# Patient Record
Sex: Male | Born: 1985 | Race: White | Hispanic: No | Marital: Single | Smoking: Current every day smoker
Health system: Southern US, Community
[De-identification: ages and names within clinical notes are randomized; demographics above are authoritative.]

## PROBLEM LIST (undated history)

## (undated) DIAGNOSIS — F4310 Post-traumatic stress disorder, unspecified: Secondary | ICD-10-CM

## (undated) DIAGNOSIS — R569 Unspecified convulsions: Secondary | ICD-10-CM

## (undated) DIAGNOSIS — G4730 Sleep apnea, unspecified: Secondary | ICD-10-CM

## (undated) DIAGNOSIS — S069XAA Unspecified intracranial injury with loss of consciousness status unknown, initial encounter: Secondary | ICD-10-CM

## (undated) DIAGNOSIS — K5090 Crohn's disease, unspecified, without complications: Secondary | ICD-10-CM

## (undated) DIAGNOSIS — S069X9A Unspecified intracranial injury with loss of consciousness of unspecified duration, initial encounter: Secondary | ICD-10-CM

---

## 2008-12-14 DIAGNOSIS — K5090 Crohn's disease, unspecified, without complications: Secondary | ICD-10-CM

## 2008-12-14 HISTORY — DX: Crohn's disease, unspecified, without complications: K50.90

## 2014-07-20 ENCOUNTER — Ambulatory Visit: Payer: Self-pay | Admitting: Family Medicine

## 2016-06-19 ENCOUNTER — Emergency Department
Admission: EM | Admit: 2016-06-19 | Discharge: 2016-06-20 | Disposition: A | Discharge status: Home / Self Care | Attending: Emergency Medicine | Admitting: Emergency Medicine

## 2016-06-19 ENCOUNTER — Encounter: Payer: Self-pay | Admitting: Emergency Medicine

## 2016-06-19 DIAGNOSIS — R1084 Generalized abdominal pain: Secondary | ICD-10-CM | POA: Diagnosis not present

## 2016-06-19 DIAGNOSIS — Z79899 Other long term (current) drug therapy: Secondary | ICD-10-CM | POA: Diagnosis not present

## 2016-06-19 DIAGNOSIS — F17200 Nicotine dependence, unspecified, uncomplicated: Secondary | ICD-10-CM | POA: Diagnosis not present

## 2016-06-19 DIAGNOSIS — F4321 Adjustment disorder with depressed mood: Secondary | ICD-10-CM

## 2016-06-19 DIAGNOSIS — Z791 Long term (current) use of non-steroidal anti-inflammatories (NSAID): Secondary | ICD-10-CM | POA: Insufficient documentation

## 2016-06-19 DIAGNOSIS — F4323 Adjustment disorder with mixed anxiety and depressed mood: Secondary | ICD-10-CM

## 2016-06-19 DIAGNOSIS — R45851 Suicidal ideations: Secondary | ICD-10-CM | POA: Diagnosis present

## 2016-06-19 DIAGNOSIS — Z046 Encounter for general psychiatric examination, requested by authority: Secondary | ICD-10-CM

## 2016-06-19 DIAGNOSIS — F4310 Post-traumatic stress disorder, unspecified: Secondary | ICD-10-CM

## 2016-06-19 DIAGNOSIS — K0889 Other specified disorders of teeth and supporting structures: Secondary | ICD-10-CM

## 2016-06-19 HISTORY — DX: Post-traumatic stress disorder, unspecified: F43.10

## 2016-06-19 HISTORY — DX: Crohn's disease, unspecified, without complications: K50.90

## 2016-06-19 LAB — CBC
HEMATOCRIT: 44.6 % (ref 40.0–52.0)
HEMOGLOBIN: 15.3 g/dL (ref 13.0–18.0)
MCH: 28.9 pg (ref 26.0–34.0)
MCHC: 34.4 g/dL (ref 32.0–36.0)
MCV: 83.9 fL (ref 80.0–100.0)
Platelets: 306 10*3/uL (ref 150–440)
RBC: 5.32 MIL/uL (ref 4.40–5.90)
RDW: 13 % (ref 11.5–14.5)
WBC: 12.4 10*3/uL — AB (ref 3.8–10.6)

## 2016-06-19 LAB — COMPREHENSIVE METABOLIC PANEL
ALBUMIN: 4.7 g/dL (ref 3.5–5.0)
ALT: 19 U/L (ref 17–63)
AST: 22 U/L (ref 15–41)
Alkaline Phosphatase: 130 U/L — ABNORMAL HIGH (ref 38–126)
Anion gap: 8 (ref 5–15)
BUN: 9 mg/dL (ref 6–20)
CHLORIDE: 101 mmol/L (ref 101–111)
CO2: 29 mmol/L (ref 22–32)
CREATININE: 1.03 mg/dL (ref 0.61–1.24)
Calcium: 9.9 mg/dL (ref 8.9–10.3)
GFR calc Af Amer: >60 mL/min (ref >60)
GLUCOSE: 109 mg/dL — AB (ref 65–99)
POTASSIUM: 4.6 mmol/L (ref 3.5–5.1)
SODIUM: 138 mmol/L (ref 135–145)
Total Bilirubin: 0.5 mg/dL (ref 0.3–1.2)
Total Protein: 8 g/dL (ref 6.5–8.1)

## 2016-06-19 MED ORDER — CEPHALEXIN 500 MG PO CAPS
500.0000 mg | ORAL_CAPSULE | Freq: Once | ORAL | Status: AC
  Administered 2016-06-19: 500 mg via ORAL
  Filled 2016-06-19: qty 1

## 2016-06-19 NOTE — ED Triage Notes (Signed)
Pt presents to ED accompanied by [facility] for Suicidal ideation with no plan. Pt states he called suicide hotline, stating that he was feeling suicidal that he was going to hurt himself. Pt reports SI triggered by chronic teeth pain and chronic illness.

## 2016-06-19 NOTE — ED Notes (Signed)
Hydrocodone/APAP 5mg /325mg  count 91 tabs, verified by Blenda Mounts, RN and w/ patient. All home meds sent to pharmacy. Pt asked if he wanted a password created, stated we could and indicated his mother would pick up.

## 2016-06-20 DIAGNOSIS — F4310 Post-traumatic stress disorder, unspecified: Secondary | ICD-10-CM

## 2016-06-20 DIAGNOSIS — F4323 Adjustment disorder with mixed anxiety and depressed mood: Secondary | ICD-10-CM

## 2016-06-20 DIAGNOSIS — K0889 Other specified disorders of teeth and supporting structures: Secondary | ICD-10-CM

## 2016-06-20 DIAGNOSIS — Z046 Encounter for general psychiatric examination, requested by authority: Secondary | ICD-10-CM

## 2016-06-20 LAB — SALICYLATE LEVEL

## 2016-06-20 LAB — ETHANOL: Alcohol, Ethyl (B): <5 mg/dL (ref <5)

## 2016-06-20 LAB — URINE DRUG SCREEN, QUALITATIVE (ARMC ONLY)
Amphetamines, Ur Screen: NOT DETECTED
BARBITURATES, UR SCREEN: NOT DETECTED
BENZODIAZEPINE, UR SCRN: NOT DETECTED
Cannabinoid 50 Ng, Ur ~~LOC~~: NOT DETECTED
Cocaine Metabolite,Ur ~~LOC~~: NOT DETECTED
MDMA (Ecstasy)Ur Screen: NOT DETECTED
METHADONE SCREEN, URINE: NOT DETECTED
Opiate, Ur Screen: POSITIVE — AB
Phencyclidine (PCP) Ur S: NOT DETECTED
TRICYCLIC, UR SCREEN: POSITIVE — AB

## 2016-06-20 LAB — ACETAMINOPHEN LEVEL

## 2016-06-20 MED ORDER — KETOROLAC TROMETHAMINE 60 MG/2ML IM SOLN
INTRAMUSCULAR | Status: AC
  Administered 2016-06-20: 60 mg via INTRAMUSCULAR
  Filled 2016-06-20: qty 2

## 2016-06-20 MED ORDER — LIDOCAINE VISCOUS 2 % MT SOLN
15.0000 mL | Freq: Once | OROMUCOSAL | Status: AC
  Administered 2016-06-20: 15 mL via OROMUCOSAL
  Filled 2016-06-20: qty 15

## 2016-06-20 MED ORDER — HYDROXYZINE HCL 25 MG PO TABS
ORAL_TABLET | ORAL | Status: AC
Start: 2016-06-20 — End: 2016-06-20
  Filled 2016-06-20: qty 1

## 2016-06-20 MED ORDER — KETOROLAC TROMETHAMINE 60 MG/2ML IM SOLN
60.0000 mg | Freq: Once | INTRAMUSCULAR | Status: AC
  Administered 2016-06-20: 60 mg via INTRAMUSCULAR
  Filled 2016-06-20: qty 2

## 2016-06-20 MED ORDER — ACETAMINOPHEN 500 MG PO TABS
1000.0000 mg | ORAL_TABLET | Freq: Once | ORAL | Status: AC
  Administered 2016-06-20: 1000 mg via ORAL
  Filled 2016-06-20: qty 2

## 2016-06-20 MED ORDER — HYDROXYZINE HCL 25 MG PO TABS
25.0000 mg | ORAL_TABLET | Freq: Three times a day (TID) | ORAL | Status: DC | PRN
  Administered 2016-06-20 (×2): 25 mg via ORAL
  Filled 2016-06-20 (×2): qty 1

## 2016-06-20 MED ORDER — AMOXICILLIN-POT CLAVULANATE 875-125 MG PO TABS: 1.0000 | ORAL_TABLET | Freq: Two times a day (BID) | ORAL | 0 refills | Status: AC

## 2016-06-20 MED ORDER — LIDOCAINE VISCOUS 2 % MT SOLN
15.0000 mL | Freq: Once | OROMUCOSAL | Status: AC
  Administered 2016-06-20: 15 mL via OROMUCOSAL
  Filled 2016-06-20 (×2): qty 15

## 2016-06-20 NOTE — ED Notes (Signed)
Patient resting quietly in room. No noted distress or abnormal behaviors noted. Will continue 15 minute checks and observation by security camera for safety. 

## 2016-06-20 NOTE — ED Notes (Signed)
Patient asleep in room. No noted distress or abnormal behavior. Will continue 15 minute checks and observation by security cameras for safety. 

## 2016-06-20 NOTE — ED Notes (Signed)
Patient came to nursing staff and stated that he was starting to feel quite panicked and upset because he was in so much pain and requested lidocaine. Per physician orders, patient was given lidocaine orally and 25 mg vistaril. Will continue to monitor patient for increased anxiety. Maintained on 15 minute checks and observation by security camera for safety.

## 2016-06-20 NOTE — ED Notes (Signed)

## 2016-06-20 NOTE — ED Notes (Signed)
Patient came over and asked when he would be discharged. Patient informed of the discharge process and told that he would be notified as soon as papers were ready. Patient agreed with this plan. Patient remains calm and cooperative at this time. Maintained on 15 minute checks and observation by security camera for safety.

## 2016-06-20 NOTE — ED Notes (Addendum)
Patient denies SI/HI/AVH and pain. Patient belongings include 2 tshirts, underwear, socks, tennis shoes, black vape, cell phone, 1 cooler of medication. Patient confirmed that all medication and belongings were present and accounted for.  Patient discharged to home, will be picked up by his mother. Patient says that he feels better and that he is stable enough to be discharged to home.

## 2016-06-20 NOTE — Consult Note (Signed)
[facility] Consult   Reason for Consult:  Consult for 30 year old man who was brought to the emergency room after calling a Hotline and reporting suicidal ideation. Referring Physician:  Jimmye Norman Patient Identification: William Vaughan MRN:  [id number] Principal Diagnosis: Adjustment disorder with mixed anxiety and depressed mood Diagnosis:   Patient Active Problem List   Diagnosis Date Noted  . Adjustment disorder with mixed anxiety and depressed mood [F43.23] 06/20/2016  . PTSD (post-traumatic stress disorder) [F43.10] 06/20/2016  . Involuntary commitment [Z04.6] 06/20/2016  . Toothache [K08.89] 06/20/2016    Total Time spent with patient: 1 hour  Subjective:   William Vaughan is a 30 y.o. male patient admitted with "I have an excruciating toothache".  HPI:  Patient seen. Chart reviewed. Labs and vitals reviewed. 31 year old man who reports a history of posttraumatic stress disorder and depression says that he's been feeling overwhelmed by his medical problems. He says that he has Crohn's disease that causes chronic abdominal and hip pain. He also has chronic toothache. He says he's been told he has about 11 broken teeth and has infection and swelling on the left side but has been unable to get the [place] system to fix it. His pain had been getting worse recently. He was having suicidal thoughts specifically related to his pain and called 911. Patient says that he is on medication for depression and PTSD including Cymbalta but doesn't remember the other medicine. Mood stays stressed out and anxious much of the time. Sleep is adequate with his current medicine. Appetite is been poor. Denies any hallucinations. Denies any drug or alcohol abuse. Patient says that he is feeling a little better today. Not quite as overwhelmed. Denies any actual wish to harm himself or hurt anyone else. He lives by himself. Seems to have minimal support. He says that he is service-connected but for some  reason has been fighting with the [facility] over the level of care he is getting.  Medical history: Diagnosis reportedly of Crohn's disease. Takes what sounds like appropriate medicine. Also diagnosis of PTSD and depression. Chronic toothache which for some reason seems to still be inadequately treated despite having been there for months.  Social history: Reports that he lives by himself. He does have some family in the area. He is not currently working. He gets disability from the [employer]. Served in the [employer] until 2012.  Substance abuse history: Past history of alcohol abuse stopped using around 2010. Denies any past drug use.  Past Psychiatric History: Patient says he has had 2 prior psychiatric hospitalizations one in [place] at the [place] and another one in [place]. Denies ever having tried to kill himself in the past. Denies any history of violence outside of appropriate behavior in the [employer]. Diagnosis of PTSD and depression. Does not remember all the medicines he has been on.  Risk to Self: Suicidal Ideation: Yes-Currently Present Suicidal Intent: No Is patient at risk for suicide?: No Suicidal Plan?: No Access to Means: No What has been your use of drugs/alcohol within the last 12 months?: None reported How many times?: 0 Other Self Harm Risks: none identified Triggers for Past Attempts: None known Intentional Self Injurious Behavior: None Risk to Others: Homicidal Ideation: No Thoughts of Harm to Others: No Current Homicidal Intent: No Current Homicidal Plan: No Access to Homicidal Means: No Identified Victim: none identified History of harm to others?: No Assessment of Violence: None Noted Violent Behavior Description: none identified Does patient have access to weapons?: No Criminal  Charges Pending?: No Does patient have a court date: No Prior Inpatient Therapy: Prior Inpatient Therapy: No Prior Therapy Dates: na Prior Therapy Facilty/Provider(s): na Reason for Treatment:  na Prior Outpatient Therapy: Prior Outpatient Therapy: Yes Prior Therapy Dates: current Prior Therapy Facilty/Provider(s): [facility] Reason for Treatment: PTSD Does patient have an ACCT team?: No Does patient have Intensive In-House Services?  : No Does patient have [facility] services? : No Does patient have P4CC services?: No  Past Medical History:  Past Medical History:  Diagnosis Date  . Crohn's disease ([place]) 12/2008  . PTSD (post-traumatic stress disorder)    History reviewed. No pertinent surgical history. Family History: History reviewed. No pertinent family history. Family Psychiatric  History: Denies knowing of any family history of mental illness Social History:  History  Alcohol use Not on file     History  Drug use: Unknown    Social History   Social History  . Marital status: Single    Spouse name: N/A  . Number of children: N/A  . Years of education: N/A   Social History Main Topics  . Smoking status: Current Every Day Smoker  . Smokeless tobacco: Never Used  . Alcohol use None  . Drug use: Unknown  . Sexual activity: Not Asked   Other Topics Concern  . None   Social History Narrative  . None   Additional Social History:    Allergies:  No Known Allergies  Labs:  Results for orders placed or performed during the hospital encounter of 06/19/16 (from the past 48 hour(s))  Comprehensive metabolic panel     Status: Abnormal   Collection Time: 06/19/16 10:42 PM  Result Value Ref Range   Sodium 138 135 - 145 mmol/L   Potassium 4.6 3.5 - 5.1 mmol/L   Chloride 101 101 - 111 mmol/L   CO2 29 22 - 32 mmol/L   Glucose, Bld 109 (H) 65 - 99 mg/dL   BUN 9 6 - 20 mg/dL   Creatinine, Ser 1.03 0.61 - 1.24 mg/dL   Calcium 9.9 8.9 - 10.3 mg/dL   Total Protein 8.0 6.5 - 8.1 g/dL   Albumin 4.7 3.5 - 5.0 g/dL   AST 22 15 - 41 U/L   ALT 19 17 - 63 U/L   Alkaline Phosphatase 130 (H) 38 - 126 U/L   Total Bilirubin 0.5 0.3 - 1.2 mg/dL   GFR calc non Af Amer >60  >60 mL/min   GFR calc Af Amer >60 >60 mL/min    Comment: (NOTE) The eGFR has been calculated using the CKD EPI equation. This calculation has not been validated in all clinical situations. eGFR's persistently <60 mL/min signify possible Chronic Kidney Disease.    Anion gap 8 5 - 15  Ethanol     Status: None   Collection Time: 06/19/16 10:42 PM  Result Value Ref Range   Alcohol, Ethyl (B) <5 <5 mg/dL    Comment:        LOWEST DETECTABLE LIMIT FOR SERUM ALCOHOL IS 5 mg/dL FOR MEDICAL PURPOSES ONLY   Salicylate level     Status: None   Collection Time: 06/19/16 10:42 PM  Result Value Ref Range   Salicylate Lvl <9.5 2.8 - 30.0 mg/dL  Acetaminophen level     Status: Abnormal   Collection Time: 06/19/16 10:42 PM  Result Value Ref Range   Acetaminophen (Tylenol), Serum <10 (L) 10 - 30 ug/mL    Comment:        THERAPEUTIC CONCENTRATIONS  VARY SIGNIFICANTLY. A RANGE OF 10-30 ug/mL MAY BE AN EFFECTIVE CONCENTRATION FOR MANY PATIENTS. HOWEVER, SOME ARE BEST TREATED AT CONCENTRATIONS OUTSIDE THIS RANGE. ACETAMINOPHEN CONCENTRATIONS >150 ug/mL AT 4 HOURS AFTER INGESTION AND >50 ug/mL AT 12 HOURS AFTER INGESTION ARE OFTEN ASSOCIATED WITH TOXIC REACTIONS.   cbc     Status: Abnormal   Collection Time: 06/19/16 10:42 PM  Result Value Ref Range   WBC 12.4 (H) 3.8 - 10.6 K/uL   RBC 5.32 4.40 - 5.90 MIL/uL   Hemoglobin 15.3 13.0 - 18.0 g/dL   HCT 44.6 40.0 - 52.0 %   MCV 83.9 80.0 - 100.0 fL   MCH 28.9 26.0 - 34.0 pg   MCHC 34.4 32.0 - 36.0 g/dL   RDW 13.0 11.5 - 14.5 %   Platelets 306 150 - 440 K/uL  Urine Drug Screen, Qualitative     Status: Abnormal   Collection Time: 06/20/16 12:05 AM  Result Value Ref Range   Tricyclic, Ur Screen POSITIVE (A) NONE DETECTED   Amphetamines, Ur Screen NONE DETECTED NONE DETECTED   MDMA (Ecstasy)Ur Screen NONE DETECTED NONE DETECTED   Cocaine Metabolite,Ur [place] NONE DETECTED NONE DETECTED   Opiate, Ur Screen POSITIVE (A) NONE DETECTED    Phencyclidine (PCP) Ur S NONE DETECTED NONE DETECTED   Cannabinoid 50 Ng, Ur [place] NONE DETECTED NONE DETECTED   Barbiturates, Ur Screen NONE DETECTED NONE DETECTED   Benzodiazepine, Ur Scrn NONE DETECTED NONE DETECTED   Methadone Scn, Ur NONE DETECTED NONE DETECTED    Comment: (NOTE) 756  Tricyclics, urine               Cutoff 1000 ng/mL 200  Amphetamines, urine             Cutoff 1000 ng/mL 300  MDMA (Ecstasy), urine           Cutoff 500 ng/mL 400  Cocaine Metabolite, urine       Cutoff 300 ng/mL 500  Opiate, urine                   Cutoff 300 ng/mL 600  Phencyclidine (PCP), urine      Cutoff 25 ng/mL 700  Cannabinoid, urine              Cutoff 50 ng/mL 800  Barbiturates, urine             Cutoff 200 ng/mL 900  Benzodiazepine, urine           Cutoff 200 ng/mL 1000 Methadone, urine                Cutoff 300 ng/mL 1100 1200 The urine drug screen provides only a preliminary, unconfirmed 1300 analytical test result and should not be used for non-medical 1400 purposes. Clinical consideration and professional judgment should 1500 be applied to any positive drug screen result due to possible 1600 interfering substances. A more specific alternate chemical method 1700 must be used in order to obtain a confirmed analytical result.  1800 Gas chromato graphy / mass spectrometry (GC/MS) is the preferred 1900 confirmatory method.     Current Facility-Administered Medications  Medication Dose Route Frequency Provider Last Rate Last Dose  . hydrOXYzine (ATARAX/VISTARIL) 25 MG tablet           . hydrOXYzine (ATARAX/VISTARIL) tablet 25 mg  25 mg Oral TID PRN Loney Hering, MD   25 mg at 06/20/16 1002   Current Outpatient Prescriptions  Medication Sig Dispense Refill  . busPIRone (BUSPAR)  10 MG tablet Take 10 mg by mouth 2 (two) times daily.    . cetirizine (ZYRTEC) 10 MG tablet Take 10 mg by mouth daily.    . cyanocobalamin 1000 MCG tablet Take 1,000 mcg by mouth daily.    Marland Kitchen dicyclomine  (BENTYL) 10 MG capsule Take 10 mg by mouth every 6 (six) hours.    . DULoxetine (CYMBALTA) 60 MG capsule Take 60 mg by mouth 2 (two) times daily.    . eszopiclone (LUNESTA) 1 MG TABS tablet Take 3 mg by mouth at bedtime. Take immediately before bedtime    . ferrous sulfate 325 (65 FE) MG EC tablet Take 325 mg by mouth daily with breakfast.    . fluocinonide cream (LIDEX) 1.61 % Apply 1 application topically 2 (two) times daily.    . folic acid (FOLVITE) 1 MG tablet Take 1 mg by mouth daily.    Marland Kitchen gabapentin (NEURONTIN) 300 MG capsule Take 300 mg by mouth 3 (three) times daily.    Marland Kitchen HYDROcodone-acetaminophen (NORCO/VICODIN) 5-325 MG tablet Take 1 tablet by mouth every 6 (six) hours as needed for moderate pain.    . hydrOXYzine (VISTARIL) 25 MG capsule Take 25 mg by mouth 3 (three) times daily as needed.    Marland Kitchen QUEtiapine (SEROQUEL) 200 MG tablet Take 100 mg by mouth at bedtime.    . salicyclic acid-sulfur (SEBULEX) 2-2 % shampoo Apply 1 application topically daily as needed for itching.    . sulfaSALAzine (AZULFIDINE) 500 MG tablet Take 1,000 mg by mouth 2 (two) times daily.    Marland Kitchen triamcinolone cream (KENALOG) 0.1 % Apply 1 application topically 2 (two) times daily.    Marland Kitchen amoxicillin-clavulanate (AUGMENTIN) 875-125 MG tablet Take 1 tablet by mouth 2 (two) times daily. 20 tablet 0    Musculoskeletal: Strength & Muscle Tone: within normal limits Gait & Station: normal Patient leans: N/A  Psychiatric Specialty Exam: Physical Exam  Nursing note and vitals reviewed. Constitutional: He appears well-developed and well-nourished.  HENT:  Head: Normocephalic and atraumatic.    Eyes: Conjunctivae are normal. Pupils are equal, round, and reactive to light.  Neck: Normal range of motion.  Cardiovascular: Regular rhythm and normal heart sounds.   Respiratory: Effort normal. No respiratory distress.  GI: Soft.  Musculoskeletal: Normal range of motion.  Neurological: He is alert.  Skin: Skin is warm  and dry.  Psychiatric: Judgment normal. His mood appears anxious. His affect is blunt. His speech is delayed. He is slowed. Cognition and memory are normal. He expresses no suicidal ideation.    Review of Systems  Constitutional: Negative.   HENT: Negative.        Complains of toothache primarily on the left side  Eyes: Negative.   Respiratory: Negative.   Cardiovascular: Negative.   Gastrointestinal: Negative.   Musculoskeletal: Negative.   Skin: Negative.   Neurological: Negative.   Psychiatric/Behavioral: Positive for depression. Negative for hallucinations, memory loss, substance abuse and suicidal ideas. The patient is nervous/anxious. The patient does not have insomnia.     Blood pressure 139/89, pulse 82, temperature 98.1 F (36.7 C), temperature source Oral, resp. rate 16, height '5\' 11"'  (1.803 m), weight 88.5 kg (195 lb), SpO2 99 %.Body mass index is 27.2 kg/m.  General Appearance: Casual  Eye Contact:  Minimal  Speech:  Slow  Volume:  Decreased  Mood:  Anxious and Depressed  Affect:  Blunt  Thought Process:  Goal Directed  Orientation:  Full (Time, Place, and Person)  Thought Content:  Logical  Suicidal Thoughts:  No  Homicidal Thoughts:  No  Memory:  Immediate;   Fair Recent;   Poor Remote;   Fair  Judgement:  Impaired  Insight:  Shallow  Psychomotor Activity:  Decreased  Concentration:  Concentration: Fair  Recall:  [employer] of Knowledge:  Fair  Language:  Fair  Akathisia:  No  Handed:  Right  AIMS (if indicated):     Assets:  Communication Skills Desire for Improvement Financial Resources/Insurance Housing Resilience  ADL's:  Intact  Cognition:  WNL  Sleep:        Treatment Plan Summary: Medication management and Plan 30 year old man who came in last night reporting suicidal ideation but did not act on it and says today he is feeling much better. No active suicidal thoughts. No evidence of psychosis. Able to articulate an appropriate plan for  dealing with his medical problems. We discussed the crucial need to get in to see a dentist as soon as possible which seems fairly obvious. I am going to give them a prescription for Augmentin for 10 days hopefully to help with some of the pain but he obviously needs to get in to see a dentist as soon as possible regardless of the potential cost. Continue other medications no new prescriptions needed. Discontinue involuntary commitment. Follow-up with the [facility].  Disposition: Patient does not meet criteria for psychiatric inpatient admission. Supportive therapy provided about ongoing stressors.  Alethia Berthold, MD 06/20/2016 12:21 PM

## 2016-06-20 NOTE — ED Notes (Signed)
Patient currently denies SI/HI/AVH and endorses severe pain rated a 10/10 in his teeth. He says that he only has SI related to his chronic pain issues and that if these were resolved he would feel better. He says that his care at the [place] is inadequate and that he wishes that there were better resources for him. Patient appears sad and depressed. Will continue to monitor.  Maintained on 15 minute checks and observation by security camera for safety.

## 2016-06-20 NOTE — BH Assessment (Signed)
Assessment Note  William Vaughan is an 30 y.o. male presenting to the ED via the police department with concerns of suicidal ideations with no plan or intent.  Patient reports he has a lot of chronic dental problems as well as Crohn's Disease.  He says that the pain he was experiencing was so bad that he thought about suicide but had no intent to do so.  He states that his teeth have been bothering him since November and he has 11 broken teeth that need to be removed. He states  that the [facility] has been giving him the run around and feels as though the [place] is purposely trying to kill him. He states that having his teeth pulled could alleviate the pain he's experiencing.   Pt denies drug/alcohol use.  Diagnosis: Depression  Past Medical History:  Past Medical History:  Diagnosis Date  . Crohn's disease ([facility]) 12/2008  . PTSD (post-traumatic stress disorder)     History reviewed. No pertinent surgical history.  Family History: History reviewed. No pertinent family history.  Social History:  reports that he has been smoking.  He has never used smokeless tobacco. His alcohol and drug histories are not on file.  Additional Social History:  Alcohol / Drug Use History of alcohol / drug use?: No history of alcohol / drug abuse  CIWA: CIWA-Ar BP: (!) 163/99 Pulse Rate: (!) 113 COWS:    Allergies: No Known Allergies  Home Medications:  (Not in a hospital admission)  OB/GYN Status:  No LMP for male patient.  General Assessment Data Location of Assessment: [facility] ED TTS Assessment: In system Is this a Tele or Face-to-Face Assessment?: Face-to-Face Is this an Initial Assessment or a Re-assessment for this encounter?: Initial Assessment Marital status: Single Maiden name: n/a Is patient pregnant?: No Pregnancy Status: No Living Arrangements: Parent Can pt return to current living arrangement?: Yes Admission Status: Voluntary Is patient capable of signing voluntary admission?:  Yes Referral Source: Self/Family/Friend Insurance type: Tricare  Medical Screening Exam [facility] Walk-in ONLY) Medical Exam completed: Yes  Crisis Care Plan Living Arrangements: Parent Legal Guardian: Other: (self) Name of Psychiatrist: [facility] [place] Name of Therapist: [facility] [place]  Education Status Is patient currently in school?: No Current Grade: na Highest grade of school patient has completed: 12th Name of school: na Contact person: na  Risk to self with the past 6 months Suicidal Ideation: Yes-Currently Present Has patient been a risk to self within the past 6 months prior to admission? : No Suicidal Intent: No Has patient had any suicidal intent within the past 6 months prior to admission? : No Is patient at risk for suicide?: No Suicidal Plan?: No Has patient had any suicidal plan within the past 6 months prior to admission? : No Access to Means: No What has been your use of drugs/alcohol within the last 12 months?: None reported Previous Attempts/Gestures: No How many times?: 0 Other Self Harm Risks: none identified Triggers for Past Attempts: None known Intentional Self Injurious Behavior: None Family Suicide History: No Recent stressful life event(s): Recent negative physical changes Persecutory voices/beliefs?: No Depression: Yes Depression Symptoms: Feeling angry/irritable, Loss of interest in usual pleasures Substance abuse history and/or treatment for substance abuse?: No Suicide prevention information given to non-admitted patients: Not applicable  Risk to Others within the past 6 months Homicidal Ideation: No Does patient have any lifetime risk of violence toward others beyond the six months prior to admission? : No Thoughts of Harm to Others: No  Current Homicidal Intent: No Current Homicidal Plan: No Access to Homicidal Means: No Identified Victim: none identified History of harm to others?: No Assessment of Violence: None Noted Violent Behavior  Description: none identified Does patient have access to weapons?: No Criminal Charges Pending?: No Does patient have a court date: No Is patient on probation?: No  Psychosis Hallucinations: None noted Delusions: None noted  Mental Status Report Appearance/Hygiene: In scrubs Eye Contact: Good Motor Activity: Freedom of movement Speech: Logical/coherent Level of Consciousness: Drowsy Mood: Pleasant Affect: Appropriate to circumstance Anxiety Level: Minimal Thought Processes: Relevant Judgement: Partial Orientation: Person, Place, Time, Situation Obsessive Compulsive Thoughts/Behaviors: None  Cognitive Functioning Concentration: Normal Memory: Recent Intact, Remote Intact IQ: Average Insight: Good Impulse Control: Good Appetite: Fair Weight Loss: 0 Weight Gain: 0 Sleep: No Change Vegetative Symptoms: None  ADLScreening [facility] Assessment Services) Patient's cognitive ability adequate to safely complete daily activities?: Yes Patient able to express need for assistance with ADLs?: Yes Independently performs ADLs?: Yes (appropriate for developmental age)  Prior Inpatient Therapy Prior Inpatient Therapy: No Prior Therapy Dates: na Prior Therapy Facilty/Provider(s): na Reason for Treatment: na  Prior Outpatient Therapy Prior Outpatient Therapy: Yes Prior Therapy Dates: current [facility] Reason for Treatment: PTSD Does patient have an ACCT team?: No Does patient have Intensive In-House Services?  : No Does patient have [facility] services? : No Does patient have P4CC services?: No  ADL Screening (condition at time of admission) Patient's cognitive ability adequate to safely complete daily activities?: Yes Patient able to express need for assistance with ADLs?: Yes Independently performs ADLs?: Yes (appropriate for developmental age)       Abuse/Neglect Assessment (Assessment to be complete while patient is alone) Physical Abuse:  Denies Verbal Abuse: Denies Sexual Abuse: Denies Exploitation of patient/patient's resources: Denies Self-Neglect: Denies Values / Beliefs Cultural Requests During Hospitalization: None Spiritual Requests During Hospitalization: None Consults Spiritual Care Consult Needed: No Social Work Consult Needed: No Merchant navy officerAdvance Directives (For Healthcare) Does patient have an advance directive?: No Would patient like information on creating an advanced directive?: No - patient declined information    Additional Information 1:1 In Past 12 Months?: No CIRT Risk: No Elopement Risk: No Does patient have medical clearance?: Yes     Disposition:  Disposition Initial Assessment Completed for this Encounter: Yes Disposition of Patient: Other dispositions Other disposition(s): Other (Comment) (Pending Psych MD consult)  On Site Evaluation by:   Reviewed with Physician:    Demontray Franta C Toivo Bordon 06/20/2016 2:30 AM

## 2016-06-20 NOTE — ED Provider Notes (Signed)
[facility] Emergency Department Provider Note   ____________________________________________   First MD Initiated Contact with Patient 06/19/16 2336     (approximate)  I have reviewed the triage vital signs and the nursing notes.   HISTORY  Chief Complaint Suicidal    HPI William Vaughan is a 30 y.o. male who comes into the hospital today with suicidal ideation. The patient reports he has a lot of chronic problems. He reports that he's had some tooth pain that is been icing on the cake. He reports that his tooth is hurting so bad that he is thinking about committing suicide. The patient reports that his teeth have been bothering him since November and he has 11 broken teeth that need to be removed. The patient also has many other chronic pain conditions. He reports that the [facility] has been giving him the run around. He takes many medications for pain but nothing has been helping with his tooth pain. The patient reports that he saw a dentist a couple of months ago and they pulled one tooth and left the rest broken in his mouth. He reports this pain in his tooth is 8 out of 10 in intensity. He has 4 out of 10 in intensity stomach pain and some mild back pain. He reports that he started having thoughts of committing suicide last night. He's had these thoughts before but has never attempted to commit suicide. He was hospitalized at the [facility] last September for suicidal ideation. He denies any drinking or drugs. The patient is unsure what to do so he is here for evaluation. He called the suicide hotline.   Past Medical History:  Diagnosis Date  . Crohn's disease (HCC) 12/2008  . PTSD (post-traumatic stress disorder)     There are no active problems to display for this patient.   History reviewed. No pertinent surgical history.  Prior to Admission medications   Medication Sig Start Date End Date Taking? Authorizing Provider  busPIRone (BUSPAR) 10 MG  tablet Take 10 mg by mouth 2 (two) times daily.   Yes Historical Provider, MD  cetirizine (ZYRTEC) 10 MG tablet Take 10 mg by mouth daily.   Yes Historical Provider, MD  cyanocobalamin 1000 MCG tablet Take 1,000 mcg by mouth daily.   Yes Historical Provider, MD  dicyclomine (BENTYL) 10 MG capsule Take 10 mg by mouth every 6 (six) hours.   Yes Historical Provider, MD  DULoxetine (CYMBALTA) 60 MG capsule Take 60 mg by mouth 2 (two) times daily.   Yes Historical Provider, MD  eszopiclone (LUNESTA) 1 MG TABS tablet Take 3 mg by mouth at bedtime. Take immediately before bedtime   Yes Historical Provider, MD  ferrous sulfate 325 (65 FE) MG EC tablet Take 325 mg by mouth daily with breakfast.   Yes Historical Provider, MD  fluocinonide cream (LIDEX) 0.05 % Apply 1 application topically 2 (two) times daily.   Yes Historical Provider, MD  folic acid (FOLVITE) 1 MG tablet Take 1 mg by mouth daily.   Yes Historical Provider, MD  gabapentin (NEURONTIN) 300 MG capsule Take 300 mg by mouth 3 (three) times daily.   Yes Historical Provider, MD  HYDROcodone-acetaminophen (NORCO/VICODIN) 5-325 MG tablet Take 1 tablet by mouth every 6 (six) hours as needed for moderate pain.   Yes Historical Provider, MD  hydrOXYzine (VISTARIL) 25 MG capsule Take 25 mg by mouth 3 (three) times daily as needed.   Yes Historical Provider, MD  QUEtiapine (SEROQUEL) 200  MG tablet Take 100 mg by mouth at bedtime.   Yes Historical Provider, MD  salicyclic acid-sulfur (SEBULEX) 2-2 % shampoo Apply 1 application topically daily as needed for itching.   Yes Historical Provider, MD  sulfaSALAzine (AZULFIDINE) 500 MG tablet Take 1,000 mg by mouth 2 (two) times daily.   Yes Historical Provider, MD  triamcinolone cream (KENALOG) 0.1 % Apply 1 application topically 2 (two) times daily.   Yes Historical Provider, MD    Allergies Review of patient's allergies indicates no known allergies.  History reviewed. No pertinent family history.  Social  History Social History  Substance Use Topics  . Smoking status: Current Every Day Smoker  . Smokeless tobacco: Never Used  . Alcohol use Not on file    Review of Systems Constitutional: No fever/chills Eyes: No visual changes. ENT: Dental pain Cardiovascular: Denies chest pain. Respiratory: Denies shortness of breath. Gastrointestinal: No abdominal pain.  No nausea, no vomiting.  No diarrhea.  No constipation. Genitourinary: Negative for dysuria. Musculoskeletal: Negative for back pain. Skin: Negative for rash. Neurological: Negative for headaches, focal weakness or numbness. Psych: Suicidal ideation  10-point ROS otherwise negative.  ____________________________________________   PHYSICAL EXAM:  VITAL SIGNS: ED Triage Vitals  Enc Vitals Group     BP 06/19/16 2245 (!) 163/99     Pulse Rate 06/19/16 2245 (!) 113     Resp 06/19/16 2245 20     Temp 06/19/16 2245 98.9 F (37.2 C)     Temp Source 06/19/16 2245 Oral     SpO2 06/19/16 2245 98 %     Weight 06/19/16 2233 195 lb (88.5 kg)     Height 06/19/16 2233 5\' 11"  (1.803 m)     Head Circumference --      Peak Flow --      Pain Score 06/19/16 2234 7     Pain Loc --      Pain Edu? --      Excl. in GC? --     Constitutional: Alert and oriented. Well appearing and in no acute distress. Eyes: Conjunctivae are normal. PERRL. EOMI. Head: Atraumatic. Nose: No congestion/rhinnorhea. Mouth/Throat: Mucous membranes are moist.  Multiple broken and decayed teeth although pain is in left molar Cardiovascular: Normal rate, regular rhythm. Grossly normal heart sounds.  Good peripheral circulation. Respiratory: Normal respiratory effort.  No retractions. Lungs CTAB. Gastrointestinal: Soft with some mild general tenderness to palpation. No distention. Active bowel sounds Musculoskeletal: No lower extremity tenderness nor edema.   Neurologic:  Normal speech and language.  Skin:  Skin is warm, dry and intact.  Psychiatric: Mood and  affect are normal.   ____________________________________________   LABS (all labs ordered are listed, but only abnormal results are displayed)  Labs Reviewed  COMPREHENSIVE METABOLIC PANEL - Abnormal; Notable for the following:       Result Value   Glucose, Bld 109 (*)    Alkaline Phosphatase 130 (*)    All other components within normal limits  ACETAMINOPHEN LEVEL - Abnormal; Notable for the following:    Acetaminophen (Tylenol), Serum <10 (*)    All other components within normal limits  CBC - Abnormal; Notable for the following:    WBC 12.4 (*)    All other components within normal limits  URINE DRUG SCREEN, QUALITATIVE ([facility] ONLY) - Abnormal; Notable for the following:    Tricyclic, Ur Screen POSITIVE (*)    Opiate, Ur Screen POSITIVE (*)    All other components within normal limits  ETHANOL  SALICYLATE LEVEL   ____________________________________________  EKG  None ____________________________________________  RADIOLOGY  None ____________________________________________   PROCEDURES  Procedure(s) performed: None  Procedures  Critical Care performed: No  ____________________________________________   INITIAL IMPRESSION / ASSESSMENT AND PLAN / ED COURSE  Pertinent labs & imaging results that were available during my care of the patient were reviewed by me and considered in my medical decision making (see chart for details).  This is a 30 year old male who comes into the hospital today with dental pain. The patient has some rotten and decayed teeth that are causing him pain. I will give the patient dose of Keflex and awake some of his blood work to return. I will also give him a shot of Toradol and a lidocaine swish and spit. I will have the patient evaluated by TTS and then the patient will be reassessed.  Clinical Course     ____________________________________________   FINAL CLINICAL IMPRESSION(S) / ED DIAGNOSES  Final diagnoses:  Suicidal  ideation      NEW MEDICATIONS STARTED DURING THIS VISIT:  New Prescriptions   No medications on file     Note:  This document was prepared using Dragon voice recognition software and may include unintentional dictation errors.    Rebecka ApleyAllison P Hoyt Leanos, MD 06/20/16 [phone]

## 2016-06-20 NOTE — ED Provider Notes (Signed)
Patient has been cleared by psychiatry for discharge.   Emily Filbert, MD 06/20/16 [phone]

## 2016-06-20 NOTE — ED Notes (Signed)
Psychiatrist at the bedside at this time. Maintained on 15 minute checks and observation by security camera for safety.

## 2016-06-20 NOTE — ED Notes (Signed)
Patient left prescription on the unit for augmentin. Patient gave verbal permission for mother to come to unit and pick up prescription on  06/21/2016.

## 2016-06-20 NOTE — ED Notes (Signed)
Patient was in room writhing and crying saying that his teeth hurt on his left side from his jaw all the way to his left ear. He rated his pain a 10/10. Per physician order, nurse administered 60 mg of toradol. Will continue to monitor patient for increased anxiety and monitor for pain relief.

## 2016-06-20 NOTE — ED Notes (Signed)
ENVIRONMENTAL ASSESSMENT Potentially harmful objects out of patient reach: Yes Personal belongings secured: Yes Patient dressed in hospital provided attire only: Yes Plastic bags out of patient reach: Yes Patient care equipment (cords, cables, call bells, lines, and drains) shortened, removed, or accounted for: Yes Equipment and supplies removed from bottom of stretcher: Yes Potentially toxic materials out of patient reach: Yes Sharps container removed or out of patient reach: Yes  Patient is in room sleeping. No signs of acute distress noted at thsi time. Maintained on 15 minute checks and observation by security camera for safety.

## 2016-06-20 NOTE — ED Notes (Signed)
Pt. To BHU from ED ambulatory without difficulty, to room [facility]. [facility] Pt. Is alert and oriented, warm and dry in no distress. Pt. Denies SI, HI, and AVH. Pt. Calm and cooperative. Pt asking about his night medications. Pt states he has really bad PTSD and sleep walks looking for rifle. Pt. Made aware of security cameras and Q15 minute rounds. Pt. Encouraged to let Nursing staff know of any concerns or needs.

## 2016-08-22 ENCOUNTER — Encounter: Payer: Self-pay | Admitting: Emergency Medicine

## 2016-08-22 ENCOUNTER — Emergency Department
Admission: EM | Admit: 2016-08-22 | Discharge: 2016-08-23 | Disposition: A | Discharge status: Other Acute Inpatient Hospital | Attending: Emergency Medicine | Admitting: Emergency Medicine

## 2016-08-22 DIAGNOSIS — G8929 Other chronic pain: Secondary | ICD-10-CM

## 2016-08-22 DIAGNOSIS — R45851 Suicidal ideations: Secondary | ICD-10-CM | POA: Diagnosis present

## 2016-08-22 DIAGNOSIS — F17290 Nicotine dependence, other tobacco product, uncomplicated: Secondary | ICD-10-CM | POA: Diagnosis not present

## 2016-08-22 DIAGNOSIS — F4310 Post-traumatic stress disorder, unspecified: Secondary | ICD-10-CM | POA: Insufficient documentation

## 2016-08-22 DIAGNOSIS — Z79899 Other long term (current) drug therapy: Secondary | ICD-10-CM | POA: Insufficient documentation

## 2016-08-22 DIAGNOSIS — F32A Depression, unspecified: Secondary | ICD-10-CM

## 2016-08-22 DIAGNOSIS — F329 Major depressive disorder, single episode, unspecified: Secondary | ICD-10-CM | POA: Insufficient documentation

## 2016-08-22 DIAGNOSIS — K5090 Crohn's disease, unspecified, without complications: Secondary | ICD-10-CM

## 2016-08-22 DIAGNOSIS — Z046 Encounter for general psychiatric examination, requested by authority: Secondary | ICD-10-CM

## 2016-08-22 LAB — CBC WITH DIFFERENTIAL/PLATELET
BASOS ABS: 0.1 10*3/uL (ref 0–0.1)
Basophils Relative: 1 %
EOS PCT: 3 %
Eosinophils Absolute: 0.2 10*3/uL (ref 0–0.7)
HEMATOCRIT: 45.1 % (ref 40.0–52.0)
Hemoglobin: 15.1 g/dL (ref 13.0–18.0)
LYMPHS PCT: 37 %
Lymphs Abs: 2.9 10*3/uL (ref 1.0–3.6)
MCH: 28 pg (ref 26.0–34.0)
MCHC: 33.5 g/dL (ref 32.0–36.0)
MCV: 83.8 fL (ref 80.0–100.0)
MONO ABS: 0.5 10*3/uL (ref 0.2–1.0)
MONOS PCT: 7 %
NEUTROS ABS: 4.2 10*3/uL (ref 1.4–6.5)
Neutrophils Relative %: 52 %
PLATELETS: 327 10*3/uL (ref 150–440)
RBC: 5.38 MIL/uL (ref 4.40–5.90)
RDW: 13.2 % (ref 11.5–14.5)
WBC: 8 10*3/uL (ref 3.8–10.6)

## 2016-08-22 LAB — ACETAMINOPHEN LEVEL: Acetaminophen (Tylenol), Serum: <10 ug/mL — ABNORMAL LOW (ref 10–30)

## 2016-08-22 LAB — COMPREHENSIVE METABOLIC PANEL
ALBUMIN: 4.6 g/dL (ref 3.5–5.0)
ALT: 23 U/L (ref 17–63)
AST: 23 U/L (ref 15–41)
Alkaline Phosphatase: 107 U/L (ref 38–126)
Anion gap: 9 (ref 5–15)
BILIRUBIN TOTAL: 0.4 mg/dL (ref 0.3–1.2)
BUN: 8 mg/dL (ref 6–20)
CHLORIDE: 104 mmol/L (ref 101–111)
CO2: 27 mmol/L (ref 22–32)
CREATININE: 1.07 mg/dL (ref 0.61–1.24)
Calcium: 9.5 mg/dL (ref 8.9–10.3)
GFR calc Af Amer: >60 mL/min (ref >60)
GLUCOSE: 99 mg/dL (ref 65–99)
POTASSIUM: 3.7 mmol/L (ref 3.5–5.1)
Sodium: 140 mmol/L (ref 135–145)
Total Protein: 7.9 g/dL (ref 6.5–8.1)

## 2016-08-22 LAB — SALICYLATE LEVEL

## 2016-08-22 LAB — ETHANOL

## 2016-08-22 MED ORDER — LORAZEPAM 2 MG PO TABS
ORAL_TABLET | ORAL | Status: AC
  Administered 2016-08-22: 2 mg via ORAL
  Filled 2016-08-22: qty 1

## 2016-08-22 MED ORDER — LORAZEPAM 2 MG PO TABS
2.0000 mg | ORAL_TABLET | Freq: Once | ORAL | Status: AC
  Administered 2016-08-22: 2 mg via ORAL

## 2016-08-22 NOTE — ED Provider Notes (Signed)
[facility] Emergency Department Provider Note  ____________________________________________   First MD Initiated Contact with Patient 08/22/16 2042     (approximate)  I have reviewed the triage vital signs and the nursing notes.   HISTORY  Chief Complaint Suicidal    HPI William Vaughan is a 30 y.o. male with a history that includes depression and PTSD who presents in the custody of [place] police under involuntary commitment.  [facility] and told them he was planning to kill himself by overdose.  He states that he has "not felt myself" for months and that he has episodes where he "goes in and out".  He reportedly asked in triage if he had had a flashback and hurt anyone and he had asked the police to put him in handcuffs because he was afraid he was dangerous.  He denies any alcohol or drug use.  He states he is not on any medications for his PTSD or or depression.  He denies any recent medical problems but just states that he does not feel like himself.  He specifically denies fever/chills, chest pain, shortness of breath, cough, nausea, vomiting, abdominal pain, dysuria, diarrhea.  He describes his emotional/psychiatric symptoms as severe.   Past Medical History:  Diagnosis Date  . Crohn's disease (HCC) 12/2008  . PTSD (post-traumatic stress disorder)     Patient Active Problem List   Diagnosis Date Noted  . Adjustment disorder with mixed anxiety and depressed mood 06/20/2016  . PTSD (post-traumatic stress disorder) 06/20/2016  . Involuntary commitment 06/20/2016  . Toothache 06/20/2016    History reviewed. No pertinent surgical history.  Prior to Admission medications   Medication Sig Start Date End Date Taking? Authorizing Provider  busPIRone (BUSPAR) 10 MG tablet Take 10 mg by mouth 2 (two) times daily.    Historical Provider, MD  cetirizine (ZYRTEC) 10 MG tablet Take 10 mg by mouth daily.    Historical  Provider, MD  cyanocobalamin 1000 MCG tablet Take 1,000 mcg by mouth daily.    Historical Provider, MD  dicyclomine (BENTYL) 10 MG capsule Take 10 mg by mouth every 6 (six) hours.    Historical Provider, MD  DULoxetine (CYMBALTA) 60 MG capsule Take 60 mg by mouth 2 (two) times daily.    Historical Provider, MD  eszopiclone (LUNESTA) 1 MG TABS tablet Take 3 mg by mouth at bedtime. Take immediately before bedtime    Historical Provider, MD  ferrous sulfate 325 (65 FE) MG EC tablet Take 325 mg by mouth daily with breakfast.    Historical Provider, MD  fluocinonide cream (LIDEX) 0.05 % Apply 1 application topically 2 (two) times daily.    Historical Provider, MD  folic acid (FOLVITE) 1 MG tablet Take 1 mg by mouth daily.    Historical Provider, MD  gabapentin (NEURONTIN) 300 MG capsule Take 300 mg by mouth 3 (three) times daily.    Historical Provider, MD  HYDROcodone-acetaminophen (NORCO/VICODIN) 5-325 MG tablet Take 1 tablet by mouth every 6 (six) hours as needed for moderate pain.    Historical Provider, MD  hydrOXYzine (VISTARIL) 25 MG capsule Take 25 mg by mouth 3 (three) times daily as needed.    Historical Provider, MD  QUEtiapine (SEROQUEL) 200 MG tablet Take 100 mg by mouth at bedtime.    Historical Provider, MD  salicyclic acid-sulfur (SEBULEX) 2-2 % shampoo Apply 1 application topically daily as needed for itching.    Historical Provider, MD  sulfaSALAzine (AZULFIDINE) 500  MG tablet Take 1,000 mg by mouth 2 (two) times daily.    Historical Provider, MD  triamcinolone cream (KENALOG) 0.1 % Apply 1 application topically 2 (two) times daily.    Historical Provider, MD    Allergies Patient has no known allergies.  No family history on file.  Social History Social History  Substance Use Topics  . Smoking status: Current Every Day Smoker    Types: Cigars  . Smokeless tobacco: Never Used  . Alcohol use Yes    Review of Systems Constitutional: No fever/chills Eyes: No visual  changes. ENT: No sore throat. Cardiovascular: Denies chest pain. Respiratory: Denies shortness of breath. Gastrointestinal: No abdominal pain.  No nausea, no vomiting.  No diarrhea.  No constipation. Genitourinary: Negative for dysuria. Musculoskeletal: Negative for back pain. Skin: Negative for rash. Neurological: Negative for headaches, focal weakness or numbness. Psych:  depression w/ SI  10-point ROS otherwise negative.  ____________________________________________   PHYSICAL EXAM:  VITAL SIGNS: ED Triage Vitals  Enc Vitals Group     BP 08/22/16 2001 (!) 155/114     Pulse Rate 08/22/16 2001 (!) 123     Resp 08/22/16 2001 18     Temp 08/22/16 2001 99.7 F (37.6 C)     Temp Source 08/22/16 2001 Oral     SpO2 08/22/16 2001 100 %     Weight 08/22/16 2008 200 lb (90.7 kg)     Height 08/22/16 2008 5\' 11"  (1.803 m)     Head Circumference --      Peak Flow --      Pain Score 08/22/16 2008 6     Pain Loc --      Pain Edu? --      Excl. in GC? --     Constitutional: Alert and oriented. Disheveled and dirty, no acute distress Eyes: Conjunctivae are normal. PERRL. EOMI. Head: Atraumatic. Nose: No congestion/rhinnorhea. Mouth/Throat: Mucous membranes are moist.  Oropharynx non-erythematous. Neck: No stridor.  No meningeal signs.   Cardiovascular: Tachycardia (borderline), improved from triage, regular rhythm. Good peripheral circulation. Grossly normal heart sounds. Respiratory: Normal respiratory effort.  No retractions. Lungs CTAB. Gastrointestinal: Soft and nontender. No distention.  Musculoskeletal: No lower extremity tenderness nor edema. No gross deformities of extremities. Neurologic:  Normal speech and language. No gross focal neurologic deficits are appreciated.  Skin:  Skin is warm, dry and intact. No rash noted. Psychiatric: Mood and affect are normal. Speech and behavior are normal.  ____________________________________________   LABS (all labs ordered are  listed, but only abnormal results are displayed)  Labs Reviewed  ACETAMINOPHEN LEVEL - Abnormal; Notable for the following:       Result Value   Acetaminophen (Tylenol), Serum <10 (*)    All other components within normal limits  COMPREHENSIVE METABOLIC PANEL  ETHANOL  CBC WITH DIFFERENTIAL/PLATELET  SALICYLATE LEVEL  URINE DRUG SCREEN, QUALITATIVE ([facility] ONLY)   ____________________________________________  EKG  ED ECG REPORT I, Takayla Baillie, the attending physician, personally viewed and interpreted this ECG.  Date: 08/22/2016 EKG Time: 20:05 Rate: 128 Rhythm: Sinus tachycardia QRS Axis: normal Intervals: normal ST/T Wave abnormalities: normal Conduction Disturbances: none Narrative Interpretation: unremarkable  ____________________________________________  RADIOLOGY   No results found.  ____________________________________________   PROCEDURES  Procedure(s) performed:   Procedures   Critical Care performed: No ____________________________________________   INITIAL IMPRESSION / ASSESSMENT AND PLAN / ED COURSE  Pertinent labs & imaging results that were available during my care of the patient were reviewed by me and  considered in my medical decision making (see chart for details).  Patient has a history of mental illness and is currently endorsing suicidality.  He was placed under involuntary commitment or to arrival and I am holding that at this time.  I believe his vital sign abnormalities are likely due to his current anxiety.  I gave him Ativan 2 mg by mouth and we are awaiting blood work results.  I will order psych and TTS consults.  There is no evidence of acute or emergent medical condition at this time other than his psychiatric distress.   Clinical Course     ____________________________________________  FINAL CLINICAL IMPRESSION(S) / ED DIAGNOSES  Final diagnoses:  Depression, unspecified depression type  PTSD (post-traumatic stress  disorder)  Suicidal ideation     MEDICATIONS GIVEN DURING THIS VISIT:  Medications  LORazepam (ATIVAN) tablet 2 mg (2 mg Oral Given 08/22/16 2045)     NEW OUTPATIENT MEDICATIONS STARTED DURING THIS VISIT:  New Prescriptions   No medications on file    Modified Medications   No medications on file    Discontinued Medications   No medications on file     Note:  This document was prepared using Dragon voice recognition software and may include unintentional dictation errors.    Loleta Rose, MD 08/23/16 [phone]

## 2016-08-22 NOTE — ED Triage Notes (Signed)
Pt presents to ED with [facility] [employer]. [facility] had [facility] crisis [facility] and told them he was going to overdose on pills. Pt denies and states he "wants to naturally die by not eating and not drinking" not by taking pills. Pt states he has "wanted to die for years" and since he is not getting treated PTSD, Crohn's, hip pain by [place] he decided now was a good time to take care of it since he wasn't homeless yet. Pt asking if he had a flashback and if he hurt anyone prior to arrival. Pt reassured no one was injured by him that we are aware of. Pt reports smoking a cigar prior to arrival; denies drug or ETOH use.

## 2016-08-22 NOTE — ED Notes (Signed)
BEHAVIORAL HEALTH ROUNDING  Patient sleeping: Yes Patient alert and oriented: Sleeping Behavior appropriate: Yes. ; If no, describe:  Nutrition and fluids offered: No, sleeping  Toileting and hygiene offered: No, sleeping  Sitter present: q15 minute observations and security monitoring  Law enforcement present: Yes ODS 

## 2016-08-22 NOTE — ED Notes (Signed)
Pt dressed out by Kathlene November, EDT. Patient's clothing including gray shorts, white shirt, underwear, shoes and socks put in belongings bag. Cane left at nurses' station. Silver iphone turned off and put in belongings bag as well. Patient's wallet with twenty $20 dollar bills, driver's licenses, military IDs and bank cards put in valuables envelope and sent to safe.

## 2016-08-23 ENCOUNTER — Inpatient Hospital Stay
Admission: EM | Admit: 2016-08-23 | Discharge: 2016-08-25 | DRG: 882 | Disposition: A | Discharge status: Home / Self Care | Source: Intra-hospital | Attending: Psychiatry | Admitting: Psychiatry

## 2016-08-23 DIAGNOSIS — G8929 Other chronic pain: Secondary | ICD-10-CM | POA: Diagnosis present

## 2016-08-23 DIAGNOSIS — R45851 Suicidal ideations: Secondary | ICD-10-CM | POA: Diagnosis present

## 2016-08-23 DIAGNOSIS — F1220 Cannabis dependence, uncomplicated: Secondary | ICD-10-CM | POA: Diagnosis present

## 2016-08-23 DIAGNOSIS — Z046 Encounter for general psychiatric examination, requested by authority: Secondary | ICD-10-CM | POA: Diagnosis present

## 2016-08-23 DIAGNOSIS — F411 Generalized anxiety disorder: Secondary | ICD-10-CM | POA: Diagnosis present

## 2016-08-23 DIAGNOSIS — F4310 Post-traumatic stress disorder, unspecified: Secondary | ICD-10-CM

## 2016-08-23 DIAGNOSIS — Z79899 Other long term (current) drug therapy: Secondary | ICD-10-CM | POA: Diagnosis not present

## 2016-08-23 DIAGNOSIS — F4323 Adjustment disorder with mixed anxiety and depressed mood: Secondary | ICD-10-CM | POA: Diagnosis present

## 2016-08-23 DIAGNOSIS — K5090 Crohn's disease, unspecified, without complications: Secondary | ICD-10-CM | POA: Diagnosis present

## 2016-08-23 DIAGNOSIS — F17210 Nicotine dependence, cigarettes, uncomplicated: Secondary | ICD-10-CM | POA: Diagnosis present

## 2016-08-23 DIAGNOSIS — F329 Major depressive disorder, single episode, unspecified: Secondary | ICD-10-CM | POA: Diagnosis not present

## 2016-08-23 LAB — URINE DRUG SCREEN, QUALITATIVE (ARMC ONLY)
AMPHETAMINES, UR SCREEN: NOT DETECTED
BARBITURATES, UR SCREEN: NOT DETECTED
BENZODIAZEPINE, UR SCRN: POSITIVE — AB
COCAINE METABOLITE, UR ~~LOC~~: NOT DETECTED
Cannabinoid 50 Ng, Ur ~~LOC~~: POSITIVE — AB
MDMA (Ecstasy)Ur Screen: NOT DETECTED
METHADONE SCREEN, URINE: NOT DETECTED
Opiate, Ur Screen: NOT DETECTED
Phencyclidine (PCP) Ur S: NOT DETECTED
TRICYCLIC, UR SCREEN: POSITIVE — AB

## 2016-08-23 MED ORDER — ZOLPIDEM TARTRATE 5 MG PO TABS: 5.0000 mg | ORAL_TABLET | Freq: Every day | ORAL | Status: DC

## 2016-08-23 MED ORDER — DULOXETINE HCL 60 MG PO CPEP
60.0000 mg | ORAL_CAPSULE | Freq: Two times a day (BID) | ORAL | Status: DC
  Administered 2016-08-23: 60 mg via ORAL
  Filled 2016-08-23: qty 1

## 2016-08-23 MED ORDER — ALUM & MAG HYDROXIDE-SIMETH 200-200-20 MG/5ML PO SUSP: 30.0000 mL | ORAL | Status: DC | PRN

## 2016-08-23 MED ORDER — BUSPIRONE HCL 5 MG PO TABS
10.0000 mg | ORAL_TABLET | Freq: Two times a day (BID) | ORAL | Status: DC
  Administered 2016-08-24: 10 mg via ORAL
  Filled 2016-08-23 (×2): qty 2

## 2016-08-23 MED ORDER — LORATADINE 10 MG PO TABS
10.0000 mg | ORAL_TABLET | Freq: Every day | ORAL | Status: DC
  Administered 2016-08-23: 10 mg via ORAL
  Filled 2016-08-23: qty 1

## 2016-08-23 MED ORDER — HYDROXYZINE PAMOATE 25 MG PO CAPS
25.0000 mg | ORAL_CAPSULE | Freq: Three times a day (TID) | ORAL | Status: DC | PRN
  Administered 2016-08-23: 25 mg via ORAL
  Filled 2016-08-23 (×2): qty 1

## 2016-08-23 MED ORDER — SULFASALAZINE 500 MG PO TABS
1000.0000 mg | ORAL_TABLET | Freq: Two times a day (BID) | ORAL | Status: DC
  Administered 2016-08-23: 1000 mg via ORAL
  Filled 2016-08-23 (×2): qty 2

## 2016-08-23 MED ORDER — GABAPENTIN 300 MG PO CAPS
300.0000 mg | ORAL_CAPSULE | Freq: Three times a day (TID) | ORAL | Status: DC
  Administered 2016-08-23 – 2016-08-25 (×6): 300 mg via ORAL
  Filled 2016-08-23 (×6): qty 1

## 2016-08-23 MED ORDER — HYDROXYZINE PAMOATE 25 MG PO CAPS
25.0000 mg | ORAL_CAPSULE | Freq: Three times a day (TID) | ORAL | Status: DC | PRN
Start: 2016-08-23 — End: 2016-08-23
  Administered 2016-08-23: 25 mg via ORAL
  Filled 2016-08-23: qty 1

## 2016-08-23 MED ORDER — QUETIAPINE FUMARATE 25 MG PO TABS: 100.0000 mg | ORAL_TABLET | Freq: Every day | ORAL | Status: DC

## 2016-08-23 MED ORDER — SULFASALAZINE 500 MG PO TABS
1000.0000 mg | ORAL_TABLET | Freq: Two times a day (BID) | ORAL | Status: DC
  Administered 2016-08-23 – 2016-08-25 (×4): 1000 mg via ORAL
  Filled 2016-08-23 (×4): qty 2

## 2016-08-23 MED ORDER — ACETAMINOPHEN 325 MG PO TABS: 650.0000 mg | ORAL_TABLET | Freq: Four times a day (QID) | ORAL | Status: DC | PRN

## 2016-08-23 MED ORDER — GABAPENTIN 300 MG PO CAPS
300.0000 mg | ORAL_CAPSULE | Freq: Three times a day (TID) | ORAL | Status: DC
  Administered 2016-08-23: 300 mg via ORAL
  Filled 2016-08-23: qty 1

## 2016-08-23 MED ORDER — BUSPIRONE HCL 10 MG PO TABS
10.0000 mg | ORAL_TABLET | Freq: Two times a day (BID) | ORAL | Status: DC
  Filled 2016-08-23: qty 1

## 2016-08-23 MED ORDER — DULOXETINE HCL 60 MG PO CPEP
60.0000 mg | ORAL_CAPSULE | Freq: Two times a day (BID) | ORAL | Status: DC
  Administered 2016-08-23 – 2016-08-25 (×4): 60 mg via ORAL
  Filled 2016-08-23 (×4): qty 1

## 2016-08-23 MED ORDER — LORATADINE 10 MG PO TABS
10.0000 mg | ORAL_TABLET | Freq: Every day | ORAL | Status: DC
  Administered 2016-08-24 – 2016-08-25 (×2): 10 mg via ORAL
  Filled 2016-08-23 (×2): qty 1

## 2016-08-23 MED ORDER — MAGNESIUM HYDROXIDE 400 MG/5ML PO SUSP: 30.0000 mL | Freq: Every day | ORAL | Status: DC | PRN

## 2016-08-23 MED ORDER — QUETIAPINE FUMARATE 100 MG PO TABS
100.0000 mg | ORAL_TABLET | Freq: Every day | ORAL | Status: DC
  Administered 2016-08-23 – 2016-08-24 (×2): 100 mg via ORAL
  Filled 2016-08-23 (×2): qty 1

## 2016-08-23 NOTE — BH Assessment (Addendum)
-  Writer contacted [facility] (Cherie [phone] ext [phone]) and they are on diversion for mental health beds. She further states, the patient was recently in the ER for SI and was discharged on 08/07/2016.   -Writer spoke with patient for an updated assessment, he continues to voice SI with plan to stop eating. Patient stated he hadn't received his medications and was asking when he was going to get them. He stated he knew majority of the medicines and gets them from the [place]. Writer informed the patient's nurse (Amy T.) of the conversation.

## 2016-08-23 NOTE — ED Notes (Signed)
BEHAVIORAL HEALTH ROUNDING Patient sleeping: No. Patient alert and oriented: yes Behavior appropriate: Yes.  ; If no, describe:  Nutrition and fluids offered: yes Toileting and hygiene offered: Yes  Sitter present: q15 minute observations and security monitoring Law enforcement present: Yes  ODS  He is lying in bed watching TV

## 2016-08-23 NOTE — ED Notes (Signed)
BEHAVIORAL HEALTH ROUNDING  Patient sleeping: Yes Patient alert and oriented: Sleeping Behavior appropriate: Yes. ; If no, describe:  Nutrition and fluids offered: No, sleeping  Toileting and hygiene offered: No, sleeping  Sitter present: q15 minute observations and security monitoring  Law enforcement present: Yes ODS 

## 2016-08-23 NOTE — ED Notes (Signed)
BEHAVIORAL HEALTH ROUNDING Patient sleeping: No. Patient alert and oriented: yes Behavior appropriate: Yes.  ; If no, describe:  Nutrition and fluids offered: yes Toileting and hygiene offered: Yes  Sitter present: q15 minute observations and security  monitoring Law enforcement present: Yes  ODS  

## 2016-08-23 NOTE — ED Notes (Signed)
Franky Macho, RN  from Behavior called, and report was given on pt.

## 2016-08-23 NOTE — ED Notes (Signed)
MD Clapacs consulting at this time

## 2016-08-23 NOTE — ED Notes (Signed)
Refused to eat lunch.

## 2016-08-23 NOTE — ED Provider Notes (Signed)
-----------------------------------------   1:07 PM on 08/23/2016 -----------------------------------------  Patient has been seen and evaluated by psychiatry. He will be admitted to their service for further treatment.   Minna AntisKevin Howard Bunte, MD 08/23/16 [phone]

## 2016-08-23 NOTE — ED Notes (Signed)
Patient observed lying in bed with eyes closed  Even, unlabored respirations observed   NAD pt appears to be sleeping  I will continue to monitor along with every 15 minute visual observations and ongoing security monitoring    

## 2016-08-23 NOTE — Consult Note (Signed)
[name]   Reason for Consult:  Consult for 30 year old man brought into the hospital by officers after calling mobile crisis in reporting suicidal ideation Referring Physician:  Dahlia Client Patient Identification: William Vaughan MRN:  [id number] Principal Diagnosis: PTSD (post-traumatic stress disorder) Diagnosis:   Patient Active Problem List   Diagnosis Date Noted  . Suicidal ideation [R45.851] 08/23/2016  . Chronic pain [G89.29] 08/23/2016  . Adjustment disorder with mixed anxiety and depressed mood [F43.23] 06/20/2016  . PTSD (post-traumatic stress disorder) [F43.10] 06/20/2016  . Involuntary commitment [Z04.6] 06/20/2016  . Toothache [K08.89] 06/20/2016  . Crohn's disease ([place]) [K50.90] 12/14/2008    Total Time spent with patient: 1 hour  Subjective:   William Vaughan is a 30 y.o. male patient admitted with "I'm not feeling too good".  HPI:  Patient interviewed. Chart reviewed. Labs and vitals reviewed. This 30 year old man who came to the emergency room last night brought in by officers and placed under commitment because of calling mobile crisis in reporting suicidal ideation. Patient says he's been feeling bad for a "long time" and has been feeling suicidal for a long time. He tells me that he wants to "die with dignity". He says that his plan to kill himself is to stop eating and drinking. Patient says his mood feels down and depressed. His pain is chronic. He is distressed by his Crohn's disease and by joint pain. He is prescribed medicine outpatient by the [facility] and claims that he has been compliant with it. He denies that he's been drinking or abusing any drugs recently. Drug screen is positive for opiates. Most recent prescription of hydrocodone was only a very small number of tablets a couple weeks ago after a past history of being on fairly high doses of Vicodin chronically. Patient doesn't go into any of that as a complaint. He says he is having  hallucinations which she describes as seeing things out of the corner of his eyes.  Social history: Patient is a Higher education careers adviser. Says that he does get a [employer] pension. Not currently working. Lives by himself but has family around to check on him. He seems irritated with some of his family although his mother seems to be the closest to him.  Medical history: Says he has a diagnosis of Crohn's disease. Last time he was in the hospital couple months ago he was fixated on tooth pain which she does not mention today. Instead he mentions having chronic hip pain today.  Substance abuse history: Patient says that he used to drink regularly but that he cut back around 2007 and doesn't drink anymore. He denies that he's been using any other drugs. Drug screen is positive for opiates.  Past Psychiatric History: Patient has had 2 prior hospitalizations at [facility]. He's been to our emergency room before. He says he's been on his current medication regimen for years. Won't admit that it has been helpful for him. Denies ever actually seriously trying to kill himself in the past.  Risk to Self: Suicidal Ideation: Yes-Currently Present Suicidal Intent: Yes-Currently Present Is patient at risk for suicide?: Yes Suicidal Plan?: Yes-Currently Present Specify Current Suicidal Plan: Pt plans to stop eating and drinking Access to Means: Yes Specify Access to Suicidal Means: No discontinue his food intake What has been your use of drugs/alcohol within the last 12 months?: None Reported How many times?: 1 Other Self Harm Risks: None Reported Triggers for Past Attempts: None known Intentional Self Injurious Behavior: None Risk  to Others: Homicidal Ideation: No Thoughts of Harm to Others: No Current Homicidal Intent: No Current Homicidal Plan: No Access to Homicidal Means: No Identified Victim: N/A History of harm to others?: No Assessment of Violence: None Noted Violent Behavior Description: N/A Does patient  have access to weapons?: No Criminal Charges Pending?: No Does patient have a court date: No Prior Inpatient Therapy: Prior Inpatient Therapy: Yes Prior Therapy Dates: [facility] Prior Therapy Facilty/Provider(s): [facility] Reason for Treatment: Depression; suicidal ideations Prior Outpatient Therapy: Prior Outpatient Therapy: Yes Prior Therapy Dates: Current Prior Therapy Facilty/Provider(s): [facility] Reason for Treatment: Depression; PTSD Does patient have an ACCT team?: No Does patient have Intensive In-House Services?  : No Does patient have [facility] services? : No Does patient have P4CC services?: No  Past Medical History:  Past Medical History:  Diagnosis Date  . Crohn's disease ([place]) 12/2008  . PTSD (post-traumatic stress disorder)    History reviewed. No pertinent surgical history. Family History: No family history on file. Family Psychiatric  History: Patient is unclear about whether there is any family history of mental illness but denies any family history of suicide attempts Social History:  History  Alcohol Use  . Yes     History  Drug Use  . Types: Marijuana    Social History   Social History  . Marital status: Single    Spouse name: N/A  . Number of children: N/A  . Years of education: N/A   Social History Main Topics  . Smoking status: Current Every Day Smoker    Types: Cigars  . Smokeless tobacco: Never Used  . Alcohol use Yes  . Drug use:     Types: Marijuana  . Sexual activity: Not Asked   Other Topics Concern  . None   Social History Narrative  . None   Additional Social History:    Allergies:  No Known Allergies  Labs:  Results for orders placed or performed during the hospital encounter of 08/22/16 (from the past 48 hour(s))  Comprehensive metabolic panel     Status: None   Collection Time: 08/22/16  8:18 PM  Result Value Ref Range   Sodium 140 135 - 145 mmol/L   Potassium 3.7 3.5 - 5.1 mmol/L   Chloride 104 101 - 111 mmol/L   CO2 27 22 -  32 mmol/L   Glucose, Bld 99 65 - 99 mg/dL   BUN 8 6 - 20 mg/dL   Creatinine, Ser 1.07 0.61 - 1.24 mg/dL   Calcium 9.5 8.9 - 10.3 mg/dL   Total Protein 7.9 6.5 - 8.1 g/dL   Albumin 4.6 3.5 - 5.0 g/dL   AST 23 15 - 41 U/L   ALT 23 17 - 63 U/L   Alkaline Phosphatase 107 38 - 126 U/L   Total Bilirubin 0.4 0.3 - 1.2 mg/dL   GFR calc non Af Amer >60 >60 mL/min   GFR calc Af Amer >60 >60 mL/min    Comment: (NOTE) The eGFR has been calculated using the CKD EPI equation. This calculation has not been validated in all clinical situations. eGFR's persistently <60 mL/min signify possible Chronic Kidney Disease.    Anion gap 9 5 - 15  Ethanol     Status: None   Collection Time: 08/22/16  8:18 PM  Result Value Ref Range   Alcohol, Ethyl (B) <5 <5 mg/dL    Comment:        LOWEST DETECTABLE LIMIT FOR SERUM ALCOHOL IS 5 mg/dL FOR MEDICAL PURPOSES  ONLY   CBC with Diff     Status: None   Collection Time: 08/22/16  8:18 PM  Result Value Ref Range   WBC 8.0 3.8 - 10.6 K/uL   RBC 5.38 4.40 - 5.90 MIL/uL   Hemoglobin 15.1 13.0 - 18.0 g/dL   HCT 45.1 40.0 - 52.0 %   MCV 83.8 80.0 - 100.0 fL   MCH 28.0 26.0 - 34.0 pg   MCHC 33.5 32.0 - 36.0 g/dL   RDW 13.2 11.5 - 14.5 %   Platelets 327 150 - 440 K/uL   Neutrophils Relative % 52 %   Neutro Abs 4.2 1.4 - 6.5 K/uL   Lymphocytes Relative 37 %   Lymphs Abs 2.9 1.0 - 3.6 K/uL   Monocytes Relative 7 %   Monocytes Absolute 0.5 0.2 - 1.0 K/uL   Eosinophils Relative 3 %   Eosinophils Absolute 0.2 0 - 0.7 K/uL   Basophils Relative 1 %   Basophils Absolute 0.1 0 - 0.1 K/uL  Acetaminophen level     Status: Abnormal   Collection Time: 08/22/16  8:18 PM  Result Value Ref Range   Acetaminophen (Tylenol), Serum <10 (L) 10 - 30 ug/mL    Comment:        THERAPEUTIC CONCENTRATIONS VARY SIGNIFICANTLY. A RANGE OF 10-30 ug/mL MAY BE AN EFFECTIVE CONCENTRATION FOR MANY PATIENTS. HOWEVER, SOME ARE BEST TREATED AT CONCENTRATIONS OUTSIDE  THIS RANGE. ACETAMINOPHEN CONCENTRATIONS >150 ug/mL AT 4 HOURS AFTER INGESTION AND >50 ug/mL AT 12 HOURS AFTER INGESTION ARE OFTEN ASSOCIATED WITH TOXIC REACTIONS.   Salicylate level     Status: None   Collection Time: 08/22/16  8:18 PM  Result Value Ref Range   Salicylate Lvl <1.6 2.8 - 30.0 mg/dL    Current Facility-Administered Medications  Medication Dose Route Frequency Provider Last Rate Last Dose  . busPIRone (BUSPAR) tablet 10 mg  10 mg Oral BID Harvest Dark, MD      . DULoxetine (CYMBALTA) DR capsule 60 mg  60 mg Oral BID Harvest Dark, MD      . gabapentin (NEURONTIN) capsule 300 mg  300 mg Oral TID Harvest Dark, MD      . hydrOXYzine (VISTARIL) capsule 25 mg  25 mg Oral TID PRN Harvest Dark, MD      . loratadine (CLARITIN) tablet 10 mg  10 mg Oral Daily Harvest Dark, MD      . QUEtiapine (SEROQUEL) tablet 100 mg  100 mg Oral QHS Harvest Dark, MD      . sulfaSALAzine (AZULFIDINE) tablet 1,000 mg  1,000 mg Oral BID Harvest Dark, MD       Current Outpatient Prescriptions  Medication Sig Dispense Refill  . busPIRone (BUSPAR) 10 MG tablet Take 10 mg by mouth 2 (two) times daily.    . cetirizine (ZYRTEC) 10 MG tablet Take 10 mg by mouth daily.    . cyanocobalamin 1000 MCG tablet Take 1,000 mcg by mouth daily.    Marland Kitchen dicyclomine (BENTYL) 10 MG capsule Take 10 mg by mouth every 6 (six) hours.    . DULoxetine (CYMBALTA) 60 MG capsule Take 60 mg by mouth 2 (two) times daily.    . eszopiclone (LUNESTA) 1 MG TABS tablet Take 3 mg by mouth at bedtime. Take immediately before bedtime    . ferrous sulfate 325 (65 FE) MG EC tablet Take 325 mg by mouth daily with breakfast.    . folic acid (FOLVITE) 1 MG tablet Take 1 mg by mouth daily.    Marland Kitchen  gabapentin (NEURONTIN) 300 MG capsule Take 300 mg by mouth 3 (three) times daily.    . hydrOXYzine (VISTARIL) 25 MG capsule Take 25 mg by mouth 3 (three) times daily as needed.    Marland Kitchen QUEtiapine (SEROQUEL) 200 MG  tablet Take 100 mg by mouth at bedtime.    . sulfaSALAzine (AZULFIDINE) 500 MG tablet Take 1,000 mg by mouth 2 (two) times daily.    Marland Kitchen HYDROcodone-acetaminophen (NORCO/VICODIN) 5-325 MG tablet Take 1 tablet by mouth every 6 (six) hours as needed for moderate pain.      Musculoskeletal: Strength & Muscle Tone: within normal limits Gait & Station: ataxic Patient leans: N/A  Psychiatric Specialty Exam: Physical Exam  Nursing note and vitals reviewed. Constitutional: He appears well-developed and well-nourished.  HENT:  Head: Normocephalic and atraumatic.  Eyes: Conjunctivae are normal. Pupils are equal, round, and reactive to light.  Neck: Normal range of motion.  Cardiovascular: Regular rhythm and normal heart sounds.   Respiratory: Effort normal. No respiratory distress.  GI: Soft.  Musculoskeletal: Normal range of motion.  Neurological: He is alert.  Skin: Skin is warm and dry.  Psychiatric: His mood appears anxious. His speech is delayed. He is slowed and withdrawn. He expresses impulsivity. He exhibits a depressed mood. He expresses suicidal ideation. He exhibits abnormal recent memory.    Review of Systems  Constitutional: Negative.   HENT: Negative.   Eyes: Negative.   Respiratory: Negative.   Cardiovascular: Negative.   Gastrointestinal: Positive for diarrhea.  Musculoskeletal: Positive for joint pain.  Skin: Negative.   Neurological: Negative.   Psychiatric/Behavioral: Positive for depression, hallucinations and suicidal ideas. Negative for memory loss and substance abuse. The patient is nervous/anxious. The patient does not have insomnia.     Blood pressure 122/67, pulse 72, temperature 98 F (36.7 C), temperature source Oral, resp. rate 18, height '5\' 11"'  (1.803 m), weight 90.7 kg (200 lb), SpO2 99 %.Body mass index is 27.89 kg/m.  General Appearance: Casual  Eye Contact:  Minimal  Speech:  Slow  Volume:  Decreased  Mood:  Depressed and Irritable  Affect:   Congruent  Thought Process:  Goal Directed  Orientation:  Full (Time, Place, and Person)  Thought Content:  Rumination and Tangential  Suicidal Thoughts:  Yes.  with intent/plan  Homicidal Thoughts:  No  Memory:  Immediate;   Fair Recent;   Poor Remote;   Patient refuses to participate. He seems to have an intact remote memory.  Judgement:  Impaired  Insight:  Shallow  Psychomotor Activity:  Decreased  Concentration:  Concentration: Fair  Recall:  [employer] of Knowledge:  Fair  Language:  Fair  Akathisia:  No  Handed:  Right  AIMS (if indicated):     Assets:  Communication Skills Desire for Improvement Financial Resources/Insurance Housing Social Support  ADL's:  Intact  Cognition:  WNL  Sleep:        Treatment Plan Summary: Daily contact with patient to assess and evaluate symptoms and progress in treatment, Medication management and Plan This is a 30 year old man with a self-reported history of posttraumatic stress disorder and depression who came to the emergency room last night stating suicidal ideation. He tells me again today that he wants to die although his plan is to stop eating and drinking. Patient is very withdrawn and only partially cooperative with the exam. Has vague complaints about visual hallucinations. Patient will be admitted to the psychiatric unit. We will continue the Cymbalta and Seroquel that he is prescribed  as an outpatient as well as his gabapentin as well as his medicine for Crohn's disease. Full set of labs to be obtained. 15 minute checks in place. We have tried the [place] system but they are on diversion right now.  Disposition: Recommend psychiatric Inpatient admission when medically cleared. Supportive therapy provided about ongoing stressors.  Alethia Berthold, MD 08/23/2016 12:07 PM

## 2016-08-23 NOTE — ED Notes (Signed)
Patient refused shower. 

## 2016-08-23 NOTE — ED Notes (Signed)
Pt refused any snacks at this time. Pt continues resting.

## 2016-08-23 NOTE — ED Notes (Signed)
Patient refused  vsigns , patient states he just wanted to be left alone , notified nurse Amy T.

## 2016-08-23 NOTE — ED Notes (Signed)
Breakfast and lunch provided to him - he refuses to eat  I administered meds and he refused to drink water to swallow the pills  - mouth check performed

## 2016-08-23 NOTE — ED Notes (Signed)
Bilateral wrist handcuffs removed. Skin intact on both wrists, cap refill less than 3 seconds bilaterally, no complaints of pain from the patient.

## 2016-08-23 NOTE — ED Notes (Signed)
Called to give report, talked to charge nurse.  Charge nurse stated that they could not take report at this time due to admitting a different pt. At this time.

## 2016-08-23 NOTE — BH Assessment (Signed)
Patient is to be admitted to [facility] by Dr. Toni Amend.  Attending Physician will be Dr. Ardyth Harps.   Patient has been assigned to room 322, by [facility] Charge Nurse [place] F.   Intake Paper Work has been signed and placed on patient chart.  ER staff is aware of the admission Rivka Barbara, ER Sect.; Dr. Lenard Lance, ER MD; Murlean Iba., Patient's Nurse & Byrd Hesselbach, Patient Access).

## 2016-08-23 NOTE — ED Notes (Signed)
Called to give report, nurse that will be caring for and taking report is admitting a different patient at this time, will be ready for report on this patient at 20:30.

## 2016-08-23 NOTE — ED Notes (Signed)
Breakfast was placed in room , patient sleeping at this time

## 2016-08-23 NOTE — BH Assessment (Signed)
Assessment Note  William Vaughan is an 30 y.o. male. [place] [facility] [place]. Pt states he called the suicide hotline and reported that he was "going to stop eating and drinking (in an attempt to end his life)". Pt told this writer he's had these suicidal thoughts "a few months". Pt reports he has always called the suicide hotline in the past when he's had intrusive suicidal thoughts. Pt reports several stressors such as "finances, can't work, disability isn't enough". Pt reports visual hallucinations "a couple of times I saw a girl that was very very small hanging off the door". Pt denied homicidal ideations/command hallucinations. When assessed by this writer, pt denied substance use. Pt reports his depressive symptoms as "staying in bed for weeks". [facility]. Pt is in the process of initiating his PTSD [facility]. Pt was alert during assessment screening; however, pt was not oriented to date/time.   Diagnosis: PTSD  Past Medical History:  Past Medical History:  Diagnosis Date  . Crohn's disease ([facility]) 12/2008  . PTSD (post-traumatic stress disorder)     History reviewed. No pertinent surgical history.  Family History: No family history on file.  Social History:  reports that he has been smoking Cigars.  He has never used smokeless tobacco. He reports that he drinks alcohol. He reports that he uses drugs, including Marijuana.  Additional Social History:  Alcohol / Drug Use Pain Medications: None Reported Prescriptions: Seroquel Over the Counter: None Reported History of alcohol / drug use?: No history of alcohol / drug abuse  CIWA: CIWA-Ar BP: 139/85 Pulse Rate: (!) 108 COWS:    Allergies: No Known Allergies  Home Medications:  (Not in a hospital admission)  OB/GYN Status:  No LMP for male patient.  General Assessment Data [facility] Assessment: In  system Is this a Tele or Face-to-Face Assessment?: Face-to-Face Is this an Initial Assessment or a Re-assessment for this encounter?: Initial Assessment Marital status: Single Maiden name: N/A Is patient pregnant?: No Pregnancy Status: No Living Arrangements: Alone Can pt return to current living arrangement?: Yes Admission Status: Involuntary Is patient capable of signing voluntary admission?: No Referral Source: Self/Family/Friend Insurance type: Tricare  Medical Screening Exam [facility] Walk-in ONLY) Medical Exam completed: Yes  Crisis Care Plan Living Arrangements: Alone Legal Guardian:  Otho Bellows([facility]) [facility]iatrist[facility] The[facility]ist[facility]  Education Status Is patient currently in school?: No Current Grade: N/A Highest grade of school patient has completed: High School/GED Name of school: N/A Contact person: N/A  Risk to self with the past 6 months Suicidal Ideation: Yes-Currently Present Has patient been a risk to self within the past 6 months prior to admission? : Yes Suicidal Intent: Yes-Currently Present Has patient had any suicidal intent within the past 6 months prior to admission? : Yes Is patient at risk for suicide?: Yes Suicidal Plan?: Yes-Currently Present Has patient had any suicidal plan within the past 6 months prior to admission? : Yes Specify Current Suicidal Plan: Pt plans to stop eating and drinking Access to Means: Yes Specify Access to Suicidal Means: No discontinue his food intake What has been your use of drugs/alcohol within the last 12 months?: None Reported Previous Attempts/Gestures: Yes How many times?: 1 Other Self Harm Risks: None Reported Triggers for Past Attempts: None known Intentional Self Injurious Behavior: None Family Suicide History: Unknown Recent stressful life event(s):  Financial Problems, Job Loss, Loss (Comment), Conflict (Comment) Persecutory voices/beliefs?: No Depression: Yes Depression Symptoms: Isolating,  Feeling worthless/self pity, Feeling angry/irritable, Loss of interest in usual pleasures, Insomnia Substance abuse history and/or treatment for substance abuse?: Yes Suicide prevention information given to non-admitted patients: Yes  Risk to Others within the past 6 months Homicidal Ideation: No Does patient have any lifetime risk of violence toward others beyond the six months prior to admission? : No Thoughts of Harm to Others: No Current Homicidal Intent: No Current Homicidal Plan: No Access to Homicidal Means: No Identified Victim: N/A History of harm to others?: No Assessment of Violence: None Noted Violent Behavior Description: N/A Does patient have access to weapons?: No Criminal Charges Pending?: No Does patient have a court date: No Is patient on probation?: No  Psychosis Hallucinations: Visual Delusions: None noted  Mental Status Report Appearance/Hygiene: In scrubs, In hospital gown Eye Contact: Fair Motor Activity: Tics, Tremors Speech: Logical/coherent Level of Consciousness: Alert Mood: Depressed Affect: Depressed, Flat Anxiety Level: Minimal Thought Processes: Coherent, Relevant Judgement: Impaired Orientation: Person, Place, Situation (Pt was not oriented to date/time) Obsessive Compulsive Thoughts/Behaviors: None  Cognitive Functioning Concentration: Normal Memory: Recent Intact, Remote Intact IQ: Average Insight: Poor Impulse Control: Fair Appetite: Good Weight Loss: 0 Weight Gain: 0 Sleep: No Change Total Hours of Sleep: 5 Vegetative Symptoms: None  ADLScreening [facility] Assessment Services) Patient's cognitive ability adequate to safely complete daily activities?: Yes Patient able to express need for assistance with ADLs?: Yes Independently performs ADLs?: Yes (appropriate for developmental age)  Prior Inpatient Therapy Prior Inpatient Therapy: Yes Prior Therapy Dates: [facility] Prior Therapy Facilty/Provider(s): [facility] Reason for Treatment:  Depression; suicidal ideations  Prior Outpatient Therapy Prior Outpatient Therapy: Yes Prior Therapy Dates: Current Prior Therapy Facilty/Provider(s): [facility] Reason for Treatment: Depression; PTSD Does patient have an ACCT team?: No Does patient have Intensive In-House Services?  : No Does patient have [facility] services? : No Does patient have P4CC services?: No  ADL Screening (condition at time of admission) Patient's cognitive ability adequate to safely complete daily activities?: Yes Patient able to express need for assistance with ADLs?: Yes Independently performs ADLs?: Yes (appropriate for developmental age)       Abuse/Neglect Assessment (Assessment to be complete while patient is alone) Physical Abuse: Denies Verbal Abuse: Denies Sexual Abuse: Denies Exploitation of patient/patient's resources: Denies Self-Neglect: Denies Values / Beliefs Cultural Requests During Hospitalization: None Spiritual Requests During Hospitalization: None Consults Spiritual Care Consult Needed: No Social Work Consult Needed: No Merchant navy officer (For Healthcare) Does patient have an advance directive?: No    Additional Information 1:1 In Past 12 Months?: No CIRT Risk: No Elopement Risk: No Does patient have medical clearance?: Yes  Child/Adolescent Assessment Running Away Risk:  (Pt is an adult)  Disposition:  Disposition Initial Assessment Completed for this Encounter: Yes Disposition of Patient: Referred to (Psych MD) Patient referred to: Other (Comment) (Psych MD)  On Site Evaluation by:   Reviewed with Physician:    Wilmon Arms 08/23/2016 12:58 AM

## 2016-08-23 NOTE — ED Notes (Signed)

## 2016-08-23 NOTE — ED Notes (Signed)
ED BHU PLACEMENT JUSTIFICATION Is the patient under IVC or is there intent for IVC: Yes.   Is the patient medically cleared: Yes.   Is there vacancy in the ED BHU: Yes.   Is the population mix appropriate for patient: Yes.  Pt uses a cane at times  Is the patient awaiting placement in inpatient or outpatient setting: Yes.  Pt to be admitted to [facility] when staff/bed become available  Has the patient had a psychiatric consult: Yes.   Survey of unit performed for contraband, proper placement and condition of furniture, tampering with fixtures in bathroom, shower, and each patient room: Yes.  ; Findings:  APPEARANCE/BEHAVIOR Calm and cooperative NEURO ASSESSMENT Orientation: oriented x4  Denies pain Hallucinations: No.None noted (Hallucinations) Speech: Normal Gait: normal RESPIRATORY ASSESSMENT Even  Unlabored respirations  CARDIOVASCULAR ASSESSMENT Pulses equal   regular rate  Skin warm and dry   GASTROINTESTINAL ASSESSMENT no GI complaint EXTREMITIES Full ROM  PLAN OF CARE Provide calm/safe environment. Vital signs assessed twice daily. ED BHU Assessment once each 12-hour shift. Collaborate with TTS daily or as condition indicates. Assure the ED provider has rounded once each shift. Provide and encourage hygiene. Provide redirection as needed. Assess for escalating behavior; address immediately and inform ED provider.  Assess family dynamic and appropriateness for visitation as needed: Yes.  ; If necessary, describe findings:  Educate the patient/family about BHU procedures/visitation: Yes.  ; If necessary, describe findings:

## 2016-08-24 ENCOUNTER — Encounter: Payer: Self-pay | Admitting: Psychiatry

## 2016-08-24 DIAGNOSIS — F1220 Cannabis dependence, uncomplicated: Secondary | ICD-10-CM | POA: Diagnosis present

## 2016-08-24 DIAGNOSIS — F4323 Adjustment disorder with mixed anxiety and depressed mood: Principal | ICD-10-CM

## 2016-08-24 LAB — LIPID PANEL
CHOL/HDL RATIO: 6.3 ratio
CHOLESTEROL: 200 mg/dL (ref 0–200)
HDL: 32 mg/dL — ABNORMAL LOW (ref >40)
LDL Cholesterol: 129 mg/dL — ABNORMAL HIGH (ref 0–99)
TRIGLYCERIDES: 193 mg/dL — AB (ref <150)
VLDL: 39 mg/dL (ref 0–40)

## 2016-08-24 LAB — VITAMIN B12: VITAMIN B 12: 895 pg/mL (ref 180–914)

## 2016-08-24 LAB — TSH: TSH: 0.941 u[IU]/mL (ref 0.350–4.500)

## 2016-08-24 MED ORDER — VITAMIN B-12 1000 MCG PO TABS
1000.0000 ug | ORAL_TABLET | Freq: Every day | ORAL | Status: DC
  Administered 2016-08-24 – 2016-08-25 (×2): 1000 ug via ORAL
  Filled 2016-08-24 (×2): qty 1

## 2016-08-24 MED ORDER — NICOTINE 21 MG/24HR TD PT24
21.0000 mg | MEDICATED_PATCH | Freq: Every day | TRANSDERMAL | Status: DC
  Administered 2016-08-24 – 2016-08-25 (×2): 21 mg via TRANSDERMAL
  Filled 2016-08-24 (×2): qty 1

## 2016-08-24 NOTE — Progress Notes (Addendum)
Patient with depressed affect, withdrawn behavior, resting in bed rt recent admit. Takes am meds. No SI/HI at this time. Minimal interaction with peers. No SI/HI at this time. Safety maintained. Therapy groups encouraged.

## 2016-08-24 NOTE — [facility]HH Group Notes (Signed)
BHH LCSW Group Therapy Note  Type of Therapy and Topic:  Group Therapy:  Goals Group: SMART Goals  Participation Level:  Patient did not attend group. CSW invited patient to group.   Description of Group:   The purpose of a daily goals group is to assist and guide patients in setting recovery/wellness-related goals.  The objective is to set goals as they relate to the crisis in which they were admitted. Patients will be using SMART goal modalities to set measurable goals.  Characteristics of realistic goals will be discussed and patients will be assisted in setting and processing how one will reach their goal. Facilitator will also assist patients in applying interventions and coping skills learned in psycho-education groups to the SMART goal and process how one will achieve defined goal.  Therapeutic Goals: -Patients will develop and document one goal related to or their crisis in which brought them into treatment. -Patients will be guided by LCSW using SMART goal setting modality in how to set a measurable, attainable, realistic and time sensitive goal.  -Patients will process barriers in reaching goal. -Patients will process interventions in how to overcome and successful in reaching goal.   Summary of Patient Progress:  Patient Goal: Patient did not attend group. CSW invited patient to group.    Therapeutic Modalities:   Motivational Interviewing Engineer, manufacturing systemsCognitive Behavioral Therapy Crisis Intervention Model SMART goals setting  Ekta Dancer G. Garnette CzechSampson MSW, LCSWA 08/24/2016 11:06 AM
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
## 2016-08-24 NOTE — Progress Notes (Signed)
Patient ID: Charlaine DaltonBrian V Colunga, male   DOB: August 11, 1986, 30 y.o.   MRN: [id number] CSW attempted PSA, but pt presented as too acute, as evidenced by the pt speaking and presenting as manic with pressured speech. CSW and pt agreed to continue on following day  Lucille Crichlow F. Shaneisha Burkel, LCSWA, LCAS   08/24/16

## 2016-08-24 NOTE — H&P (Signed)
Psychiatric Admission Assessment Adult  Patient Identification: William Vaughan MRN:  [id number] Date of Evaluation:  08/24/2016 Chief Complaint:  Depression Principal Diagnosis: Adjustment disorder with mixed anxiety and depressed mood Diagnosis:   Patient Active Problem List   Diagnosis Date Noted  . Cannabis use disorder, moderate, dependence (HCC) [F12.20] 08/24/2016  . Suicidal ideation [R45.851] 08/23/2016  . Chronic pain [G89.29] 08/23/2016  . Adjustment disorder with mixed anxiety and depressed mood [F43.23] 06/20/2016  . PTSD (post-traumatic stress disorder) [F43.10] 06/20/2016  . Involuntary commitment [Z04.6] 06/20/2016  . Toothache [K08.89] 06/20/2016  . Crohn's disease (HCC) [K50.90] 12/14/2008   History of Present Illness:   Identifying data. William Vaughan is a 30 year old male with a history of anxiety, mood instability, and chronic pain.  Chief complaint. "I wasn't really suicidal."  History of present illness. Information was obtained from the patient and the chart. 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 He reports that he has been a patient at the [facility]. He used to receive comprehensive treatment for PTSD there including medication and psychotherapy. At some point he stopped attending his appointments as he did not have transportation. [facility] not [facility] because "he does not trust the [place]". He has been in some kind of a dispute with them and has been writing his representatives and talking to TV. He apparently is waiting to be included in PTSD treatment again. He reports partial compliance with medication and blames the [place] system for not sending his medications on time. This is true for his psychotropic medication as well as medicines prescribed for Crohn's disease. He reports many symptoms of depression with poor sleep,  decreased appetite and weight loss, social isolation, anhedonia, feeling of guilt and hopelessness worthlessness, poor energy and concentration, crying spells, and suicidal thinking. The patient frequently feels suicidal but claims that he would never hurt himself except for starting himself to death. He reports poor appetite and abdominal pain from Crohn's disease and believes that it would be no different if he did not eat. He claims that at some point he did not have any food for 30 days. He does have access to guns but is very department that this will be cooperative. And inappropriate use of arms to kill himself. Recently he called crisis line twice in September where he was brought to the emergency room and discharged to home and again now. On both occasions he initially claims to feel suicidal but then changed his mind. He reports some psychotic symptoms seeing a little better on his door. He denies auditory hallucinations. He appears paranoid at times. He reports heightened anxiety but does not describe any symptoms of PTSD or OCD. He denies alcohol or substance use but was positive for cannabis on admission. He was also positive for benzodiazepines. It is unclear if they were prescribed.  Past psychiatric history. He was hospitalized twice before in the [place] system. He has a Therapist, sports and a therapist at the [place]. He denies ever attempting suicide. The circumstances of his PTSD acquired during [employer] service are unclear. He has been tried on multiple medications but does not remember the names. He is quite sure the BuSpar caused serotonin syndrome. He is no longer taking it.  Family psychiatric history. Nonreported.  Social history. He was in Development worker, community for 8 years. He is 40 service connected but feels that  he is unable to receive services that he needs badly. He lives by himself. He has no support in the community. He has [employer] insurance and [facility] [place] [place] [facility]. The patient has severe Crohn's disease with frequent bouts of inflammation and chronic abdominal pain. He also believes that her problems caused some changes in his left hip making his leg shorter and causing problems and pain with mobility. He uses a walker here.  Total Time spent with patient: 1 hour  Is the patient at risk to self? Yes.    Has the patient been a risk to self in the past 6 months? Yes.    Has the patient been a risk to self within the distant past? No.  Is the patient a risk to others? No.  Has the patient been a risk to others in the past 6 months? No.  Has the patient been a risk to others within the distant past? No.   Prior Inpatient Therapy:   Prior Outpatient Therapy:    Alcohol Screening: 1. How often do you have a drink containing alcohol?: Never 9. Have you or someone else been injured as a result of your drinking?: No 10. Has a relative or friend or a doctor or another health worker been concerned about your drinking or suggested you cut down?: No Alcohol Use Disorder Identification Test Final Score (AUDIT): 0 Brief Intervention: AUDIT score less than 7 or less-screening does not suggest unhealthy drinking-brief intervention not indicated Substance Abuse History in the last 12 months:  Yes.   Consequences of Substance Abuse: Negative Previous Psychotropic Medications: Yes  Psychological Evaluations: No  Past Medical History:  Past Medical History:  Diagnosis Date  . Crohn's disease (HCC) 12/2008  . PTSD (post-traumatic stress disorder)    History reviewed. No pertinent surgical history. Family History: History reviewed. No pertinent family history.  Tobacco Screening: Have you used any form of tobacco in the last 30 days? (Cigarettes, Smokeless Tobacco, Cigars, and/or Pipes): Yes Tobacco use, Select all that apply: 4 or less cigarettes per day ("vape pen") Are you interested in Tobacco Cessation  Medications?: Yes, will notify MD for an order Counseled patient on smoking cessation including recognizing danger situations, developing coping skills and basic information about quitting provided: Yes Social History:  History  Alcohol Use  . Yes     History  Drug Use  . Types: Marijuana    Additional Social History:                           Allergies:  No Known Allergies Lab Results:  Results for orders placed or performed during the hospital encounter of 08/23/16 (from the past 48 hour(s))  Lipid panel     Status: Abnormal   Collection Time: 08/24/16 11:11 AM  Result Value Ref Range   Cholesterol 200 0 - 200 mg/dL   Triglycerides 161193 (H) <150 mg/dL   HDL 32 (L) >09>40 mg/dL   Total CHOL/HDL Ratio 6.3 RATIO   VLDL 39 0 - 40 mg/dL   LDL Cholesterol 604129 (H) 0 - 99 mg/dL    Comment:        Total Cholesterol/HDL:CHD Risk Coronary Heart Disease Risk Table                     Men   Women  1/2 Average Risk   3.4  3.3  Average Risk       5.0   4.4  2 X Average Risk   9.6   7.1  3 X Average Risk  23.4   11.0        Use the calculated Patient Ratio above and the CHD Risk Table to determine the patient's CHD Risk.        ATP III CLASSIFICATION (LDL):  <100     mg/dL   Optimal  643-329  mg/dL   Near or Above                    Optimal  130-159  mg/dL   Borderline  518-841  mg/dL   High  >660     mg/dL   Very High   TSH     Status: None   Collection Time: 08/24/16 11:11 AM  Result Value Ref Range   TSH 0.941 0.350 - 4.500 uIU/mL    Comment: Performed by a 3rd Generation assay with a functional sensitivity of <=0.01 uIU/mL.    Blood Alcohol level:  Lab Results  Component Value Date   ETH <5 08/22/2016   ETH <5 06/19/2016    Metabolic Disorder Labs:  No results found for: HGBA1C, MPG No results found for: PROLACTIN Lab Results  Component Value Date   CHOL 200 08/24/2016   TRIG 193 (H) 08/24/2016   HDL 32 (L) 08/24/2016   CHOLHDL 6.3 08/24/2016   VLDL  39 08/24/2016   LDLCALC 129 (H) 08/24/2016    Current Medications: Current Facility-Administered Medications  Medication Dose Route Frequency Provider Last Rate Last Dose  . acetaminophen (TYLENOL) tablet 650 mg  650 mg Oral Q6H PRN Audery Amel, MD      . alum & mag hydroxide-simeth (MAALOX/MYLANTA) 200-200-20 MG/5ML suspension 30 mL  30 mL Oral Q4H PRN Audery Amel, MD      . DULoxetine (CYMBALTA) DR capsule 60 mg  60 mg Oral BID Audery Amel, MD   60 mg at 08/24/16 0850  . gabapentin (NEURONTIN) capsule 300 mg  300 mg Oral TID Audery Amel, MD   300 mg at 08/24/16 1156  . hydrOXYzine (VISTARIL) capsule 25 mg  25 mg Oral TID PRN Audery Amel, MD   25 mg at 08/23/16 2309  . loratadine (CLARITIN) tablet 10 mg  10 mg Oral Daily Audery Amel, MD   10 mg at 08/24/16 0850  . magnesium hydroxide (MILK OF MAGNESIA) suspension 30 mL  30 mL Oral Daily PRN Audery Amel, MD      . nicotine (NICODERM CQ - dosed in mg/24 hours) patch 21 mg  21 mg Transdermal Daily Annalynne Ibanez B Austine Wiedeman, MD   21 mg at 08/24/16 0850  . QUEtiapine (SEROQUEL) tablet 100 mg  100 mg Oral QHS Audery Amel, MD   100 mg at 08/23/16 2218  . sulfaSALAzine (AZULFIDINE) tablet 1,000 mg  1,000 mg Oral BID Audery Amel, MD   1,000 mg at 08/24/16 0800   PTA Medications: Prescriptions Prior to Admission  Medication Sig Dispense Refill Last Dose  . busPIRone (BUSPAR) 10 MG tablet Take 10 mg by mouth 2 (two) times daily.   08/22/2016 at Unknown time  . cetirizine (ZYRTEC) 10 MG tablet Take 10 mg by mouth daily.   08/22/2016 at Unknown time  . cyanocobalamin 1000 MCG tablet Take 1,000 mcg by mouth daily.   08/22/2016 at Unknown time  . dicyclomine (BENTYL) 10 MG capsule  Take 10 mg by mouth every 6 (six) hours.     . DULoxetine (CYMBALTA) 60 MG capsule Take 60 mg by mouth 2 (two) times daily.   08/22/2016 at Unknown time  . eszopiclone (LUNESTA) 1 MG TABS tablet Take 3 mg by mouth at bedtime. Take immediately before bedtime      . ferrous sulfate 325 (65 FE) MG EC tablet Take 325 mg by mouth daily with breakfast.     . folic acid (FOLVITE) 1 MG tablet Take 1 mg by mouth daily.   08/22/2016 at Unknown time  . gabapentin (NEURONTIN) 300 MG capsule Take 300 mg by mouth 3 (three) times daily.   08/22/2016 at Unknown time  . HYDROcodone-acetaminophen (NORCO/VICODIN) 5-325 MG tablet Take 1 tablet by mouth every 6 (six) hours as needed for moderate pain.     . hydrOXYzine (VISTARIL) 25 MG capsule Take 25 mg by mouth 3 (three) times daily as needed.   08/22/2016 at Unknown time  . QUEtiapine (SEROQUEL) 200 MG tablet Take 100 mg by mouth at bedtime.   Past Week at Unknown time  . sulfaSALAzine (AZULFIDINE) 500 MG tablet Take 1,000 mg by mouth 2 (two) times daily.   08/22/2016 at Unknown time    Musculoskeletal: Strength & Muscle Tone: within normal limits Gait & Station: normal Patient leans: N/A  Psychiatric Specialty Exam: I reviewed physical exam performed in the emergency room and agree with the findings. Physical Exam  Nursing note and vitals reviewed.   Review of Systems  Constitutional: Positive for weight loss.  Gastrointestinal: Positive for abdominal pain.  Musculoskeletal: Positive for joint pain.  Psychiatric/Behavioral: Positive for depression, hallucinations, substance abuse and suicidal ideas. The patient is nervous/anxious.   All other systems reviewed and are negative.   Blood pressure 125/76, pulse 91, temperature 98.6 F (37 C), temperature source Oral, resp. rate 18, height 5\' 11"  (1.803 m), weight 92.5 kg (204 lb), SpO2 98 %.Body mass index is 28.45 kg/m.  See SRA.                                                  Sleep:  Number of Hours: 6    Treatment Plan Summary: Daily contact with patient to assess and evaluate symptoms and progress in treatment and Medication management   Mr. Shirer is a 30 year old male with a history of depression, anxiety, mood instability and  substance abuse admitted for suicidal ideation.  1. Suicidal ideation. The patient is able to contract for safety in the hospital.  2. Mood. He has been maintained on Cymbalta and Seroquel for depression and mood stabilization.  3. Anxiety. He is on Neurontin, and Vistaril. He claims to have had serotonin syndrome from BuSpar. No longer taking it.  4. Smoking. Nicotine patch is available.  5. Crohn's disease. She is on sulfasalazine and Humera.   6. Metabolic syndrome monitoring. Lipid profile, hemoglobin A1c and TSH are pending.  7. EKG. Pending.  8. Cannabis abuse. The patient minimizes his problems and declines treatment. He is also positive for bnzodiazepines on admission.   9. Disposition. He will be discharged to home. He will follow up with his providers at the [place].   Observation Level/Precautions:  15 minute checks  Laboratory:  CBC Chemistry Profile UDS UA  Psychotherapy:    Medications:    Consultations:    Discharge  Concerns:    Estimated LOS:  Other:     Physician Treatment Plan for Primary Diagnosis: Adjustment disorder with mixed anxiety and depressed mood Long Term Goal(s): Improvement in symptoms so as ready for discharge  Short Term Goals: Ability to identify changes in lifestyle to reduce recurrence of condition will improve, Ability to verbalize feelings will improve, Ability to disclose and discuss suicidal ideas, Ability to demonstrate self-control will improve, Ability to identify and develop effective coping behaviors will improve, Ability to maintain clinical measurements within normal limits will improve and Compliance with prescribed medications will improve  Physician Treatment Plan for Secondary Diagnosis: Principal Problem:   Adjustment disorder with mixed anxiety and depressed mood Active Problems:   PTSD (post-traumatic stress disorder)   Involuntary commitment   Crohn's disease (HCC)   Suicidal ideation   Chronic pain   Cannabis use  disorder, moderate, dependence (HCC)  Long Term Goal(s): Improvement in symptoms so as ready for discharge  Short Term Goals: Ability to identify changes in lifestyle to reduce recurrence of condition will improve, Ability to demonstrate self-control will improve and Ability to identify triggers associated with substance abuse/mental health issues will improve  I certify that inpatient services furnished can reasonably be expected to improve the patient's condition.    Kristine Linea, MD 11/9/20172:24 PM

## 2016-08-24 NOTE — [facility] Group Notes (Signed)
BHH LCSW Group Therapy  08/24/2016 2:36 PM  Type of Therapy:  Group Therapy  Participation Level:  Active  Participation Quality:  Appropriate and Sharing  Affect:  Appropriate  Cognitive:  Appropriate  Insight:  Engaged  Engagement in Therapy:  Engaged  Modes of Intervention:  Activity, Discussion, Education and Support  Summary of Progress/Problems:Balance in life: Patients will discuss the concept of balance and how it looks and feels to be unbalanced. Pt will identify areas in their life that is unbalanced and ways to become more balanced.    Enrique Manganaro G. Garnette CzechSampson MSW, LCSWA 08/24/2016, 2:37 PM

## 2016-08-24 NOTE — Tx Team (Signed)
Initial Treatment Plan 08/24/2016 7:04 AM William DaltonBrian V Burciaga ZOX:[id number]    PATIENT STRESSORS: Financial difficulties Health problems Marital or family conflict Other: "[facility]"   PATIENT STRENGTHS: Active sense of humor Average or above average intelligence Communication skills   PATIENT IDENTIFIED PROBLEMS: Suicidal ideation  Chron's disease flair ups                   DISCHARGE CRITERIA:  Ability to meet basic life and health needs Improved stabilization in mood, thinking, and/or behavior Motivation to continue treatment in a less acute level of care  PRELIMINARY DISCHARGE PLAN: Outpatient therapy Return to previous living arrangement  PATIENT/FAMILY INVOLVEMENT: This treatment plan has been presented to and reviewed with the patient, William DaltonBrian V Jent.  The patient and family have been given the opportunity to ask questions and make suggestions.  Rockie NeighboursLuke B Valdis Bevill, RN 08/24/2016, 7:04 AM
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
## 2016-08-24 NOTE — Tx Team (Signed)
Interdisciplinary Treatment and Diagnostic Plan Update  08/24/2016 Time of Session: 10:30am ANTONIUS Vaughan MRN: [id number]  Principal Diagnosis: Adjustment disorder with mixed anxiety and depressed mood  Secondary Diagnoses: Principal Problem:   Adjustment disorder with mixed anxiety and depressed mood Active Problems:   PTSD (post-traumatic stress disorder)   Involuntary commitment   Crohn's disease (HCC)   Suicidal ideation   Current Medications:  Current Facility-Administered Medications  Medication Dose Route Frequency Provider Last Rate Last Dose  . acetaminophen (TYLENOL) tablet 650 mg  650 mg Oral Q6H PRN Audery Amel, MD      . alum & mag hydroxide-simeth (MAALOX/MYLANTA) 200-200-20 MG/5ML suspension 30 mL  30 mL Oral Q4H PRN Audery Amel, MD      . busPIRone (BUSPAR) tablet 10 mg  10 mg Oral BID Audery Amel, MD   10 mg at 08/24/16 0850  . DULoxetine (CYMBALTA) DR capsule 60 mg  60 mg Oral BID Audery Amel, MD   60 mg at 08/24/16 0850  . gabapentin (NEURONTIN) capsule 300 mg  300 mg Oral TID Audery Amel, MD   300 mg at 08/24/16 0849  . hydrOXYzine (VISTARIL) capsule 25 mg  25 mg Oral TID PRN Audery Amel, MD   25 mg at 08/23/16 2309  . loratadine (CLARITIN) tablet 10 mg  10 mg Oral Daily Audery Amel, MD   10 mg at 08/24/16 0850  . magnesium hydroxide (MILK OF MAGNESIA) suspension 30 mL  30 mL Oral Daily PRN Audery Amel, MD      . nicotine (NICODERM CQ - dosed in mg/24 hours) patch 21 mg  21 mg Transdermal Daily Jolanta B Pucilowska, MD   21 mg at 08/24/16 0850  . QUEtiapine (SEROQUEL) tablet 100 mg  100 mg Oral QHS Audery Amel, MD   100 mg at 08/23/16 2218  . sulfaSALAzine (AZULFIDINE) tablet 1,000 mg  1,000 mg Oral BID Audery Amel, MD   1,000 mg at 08/24/16 0800   PTA Medications: Prescriptions Prior to Admission  Medication Sig Dispense Refill Last Dose  . busPIRone (BUSPAR) 10 MG tablet Take 10 mg by mouth 2 (two) times daily.   08/22/2016 at  Unknown time  . cetirizine (ZYRTEC) 10 MG tablet Take 10 mg by mouth daily.   08/22/2016 at Unknown time  . cyanocobalamin 1000 MCG tablet Take 1,000 mcg by mouth daily.   08/22/2016 at Unknown time  . dicyclomine (BENTYL) 10 MG capsule Take 10 mg by mouth every 6 (six) hours.     . DULoxetine (CYMBALTA) 60 MG capsule Take 60 mg by mouth 2 (two) times daily.   08/22/2016 at Unknown time  . eszopiclone (LUNESTA) 1 MG TABS tablet Take 3 mg by mouth at bedtime. Take immediately before bedtime     . ferrous sulfate 325 (65 FE) MG EC tablet Take 325 mg by mouth daily with breakfast.     . folic acid (FOLVITE) 1 MG tablet Take 1 mg by mouth daily.   08/22/2016 at Unknown time  . gabapentin (NEURONTIN) 300 MG capsule Take 300 mg by mouth 3 (three) times daily.   08/22/2016 at Unknown time  . HYDROcodone-acetaminophen (NORCO/VICODIN) 5-325 MG tablet Take 1 tablet by mouth every 6 (six) hours as needed for moderate pain.     . hydrOXYzine (VISTARIL) 25 MG capsule Take 25 mg by mouth 3 (three) times daily as needed.   08/22/2016 at Unknown time  . QUEtiapine (SEROQUEL) 200 MG  tablet Take 100 mg by mouth at bedtime.   Past Week at Unknown time  . sulfaSALAzine (AZULFIDINE) 500 MG tablet Take 1,000 mg by mouth 2 (two) times daily.   08/22/2016 at Unknown time    Patient Stressors: Financial difficulties Health problems Marital or family conflict Other: "the [place]"  Patient Strengths: Active sense of humor Average or above average intelligence Communication skills  Treatment Modalities: Medication Management, Group therapy, Case management,  1 to 1 session with clinician, Psychoeducation, Recreational therapy.   Physician Treatment Plan for Primary Diagnosis: Adjustment disorder with mixed anxiety and depressed mood Long Term Goal(s): Improvement in symptoms so as ready for discharge Improvement in symptoms so as ready for discharge   Short Term Goals: Ability to identify changes in lifestyle to reduce  recurrence of condition will improve Ability to verbalize feelings will improve Ability to disclose and discuss suicidal ideas Ability to demonstrate self-control will improve Ability to identify and develop effective coping behaviors will improve Ability to maintain clinical measurements within normal limits will improve Compliance with prescribed medications will improve Ability to identify changes in lifestyle to reduce recurrence of condition will improve Ability to demonstrate self-control will improve Ability to identify triggers associated with substance abuse/mental health issues will improve  Medication Management: Evaluate patient's response, side effects, and tolerance of medication regimen.  Therapeutic Interventions: 1 to 1 sessions, Unit Group sessions and Medication administration.  Evaluation of Outcomes: Progressing  Physician Treatment Plan for Secondary Diagnosis: Principal Problem:   Adjustment disorder with mixed anxiety and depressed mood Active Problems:   PTSD (post-traumatic stress disorder)   Involuntary commitment   Crohn's disease (HCC)   Suicidal ideation  Long Term Goal(s): Improvement in symptoms so as ready for discharge Improvement in symptoms so as ready for discharge   Short Term Goals: Ability to identify changes in lifestyle to reduce recurrence of condition will improve Ability to verbalize feelings will improve Ability to disclose and discuss suicidal ideas Ability to demonstrate self-control will improve Ability to identify and develop effective coping behaviors will improve Ability to maintain clinical measurements within normal limits will improve Compliance with prescribed medications will improve Ability to identify changes in lifestyle to reduce recurrence of condition will improve Ability to demonstrate self-control will improve Ability to identify triggers associated with substance abuse/mental health issues will improve      Medication Management: Evaluate patient's response, side effects, and tolerance of medication regimen.  Therapeutic Interventions: 1 to 1 sessions, Unit Group sessions and Medication administration.  Evaluation of Outcomes: Progressing   RN Treatment Plan for Primary Diagnosis: Adjustment disorder with mixed anxiety and depressed mood Long Term Goal(s): Knowledge of disease and therapeutic regimen to maintain health will improve  Short Term Goals: Ability to remain free from injury will improve, Ability to participate in decision making will improve, Ability to verbalize feelings will improve, Ability to disclose and discuss suicidal ideas and Ability to identify and develop effective coping behaviors will improve  Medication Management: RN will administer medications as ordered by provider, will assess and evaluate patient's response and provide education to patient for prescribed medication. RN will report any adverse and/or side effects to prescribing provider.  Therapeutic Interventions: 1 on 1 counseling sessions, Psychoeducation, Medication administration, Evaluate responses to treatment, Monitor vital signs and CBGs as ordered, Perform/monitor CIWA, COWS, AIMS and Fall Risk screenings as ordered, Perform wound care treatments as ordered.  Evaluation of Outcomes: Progressing   LCSW Treatment Plan for Primary Diagnosis: Adjustment disorder with  mixed anxiety and depressed mood Long Term Goal(s): Safe transition to appropriate next level of care at discharge, Engage patient in therapeutic group addressing interpersonal concerns.  Short Term Goals: Engage patient in aftercare planning with referrals and resources, Increase social support, Increase ability to appropriately verbalize feelings, Increase emotional regulation, Facilitate acceptance of mental health diagnosis and concerns, Facilitate patient progression through stages of change regarding substance use diagnoses and concerns,  Identify triggers associated with mental health/substance abuse issues and Increase skills for wellness and recovery  Therapeutic Interventions: Assess for all discharge needs, 1 to 1 time with Social worker, Explore available resources and support systems, Assess for adequacy in community support network, Educate family and significant other(s) on suicide prevention, Complete Psychosocial Assessment, Interpersonal group therapy.  Evaluation of Outcomes: Progressing   Progress in Treatment: Attending groups: No. CSW still assessing, pt new to milieu Participating in groups: No.  CSW still assessing, pt new to milieu  Taking medication as prescribed: Yes. Toleration medication: No. Family/Significant other contact made: No, will contact:  CSW still assessing  Patient understands diagnosis: Yes. Discussing patient identified problems/goals with staff: Yes. Medical problems stabilized or resolved: Yes. Denies suicidal/homicidal ideation: Yes. Issues/concerns per patient self-inventory: No. Other: none listed  New problem(s) identified: No, Describe:  none reported  New Short Term/Long Term Goal(s): None listed  Discharge Plan or Barriers: Pt will Pt will discharge home to [facility] and [facility], substance abuse treatment and therapy.   Reason for Continuation of Hospitalization: Anxiety Depression Hallucinations Suicidal ideation Withdrawal symptoms  Estimated Length of Stay:  Attendees: Patient: William Vaughan Patient:[place] Barbaraann Boys 08/24/2016 10:46 AM  Physician: Dr. Jennet Maduro, MD 08/24/2016 10:46 AM  Nursing: Elenore Paddy, RN 08/24/2016 10:46 AM  RN Care Manager: 08/24/2016 10:46 AM  Social Worker: Dorothe Pea. Roylene Reason, LCAS 08/24/2016 10:46 AM  Recreational Therapist: Hershal Coria, LRT 08/24/2016 10:46 AM  Other:  08/24/2016 10:46 AM  Other:  08/24/2016 10:46 AM  Other: 08/24/2016 10:46 AM     Scribe for Treatment Team: Dorothe Pea Hyrum Shaneyfelt, LCSWA 08/24/2016 10:46 AM

## 2016-08-24 NOTE — Progress Notes (Signed)
Recreation Therapy Notes  Date: 11.09.17 Time: 9:30 am Location: Craft Room  Group Topic: Leisure Education  Goal Area(s) Addresses:  Patient will identify activities for each letter of the alphabet. Patient will verbalize ability to integrate positive leisure into life post d/c. Patient will verbalize ability to use leisure as a coping skill.  Behavioral Response: Did not attend  Intervention: Leisure Alphabet  Activity: Patients were given a Leisure Alphabet worksheet and were instructed to write healthy leisure activities for each letter of the alphabet.  Education: LRT educated patients on what they need to participate in leisure.  Education Outcome: Patient did not attend group.  Clinical Observations/Feedback: Patient did not attend group.  Brennyn Haisley M, LRT/CTRS 08/24/2016 10:27 AM 

## 2016-08-24 NOTE — Plan of Care (Signed)
Problem: Medication: Goal: Compliance with prescribed medication regimen will improve Outcome: Not Progressing Pt refused Buspar, but took other medications

## 2016-08-25 LAB — PROLACTIN: Prolactin: 21.5 ng/mL — ABNORMAL HIGH (ref 4.0–15.2)

## 2016-08-25 LAB — HEMOGLOBIN A1C
Hgb A1c MFr Bld: 5.1 % (ref 4.8–5.6)
Mean Plasma Glucose: 100 mg/dL

## 2016-08-25 NOTE — Discharge Summary (Signed)
Physician Discharge Summary Note  Patient:  William Vaughan is an 30 y.o., male MRN:  [id number] DOB:  Jan 05, 1986 Patient phone:  [phone] (home)  Patient address:   [address] [place] [id number],  Total Time spent with patient: 30 minutes  Date of Admission:  08/23/2016 Date of Discharge: 08/25/2016  Reason for Admission:  Suicidal ideation.  Identifying data. William Vaughan is a 30 year old male with a history of anxiety, mood instability, and chronic pain.  Chief complaint. "I wasn't really suicidal."  History of present illness. Information was obtained from the patient and the chart. 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 He reports that he has [facility]. He used to receive comprehensive treatment for PTSD there including medication and psychotherapy. At some point he stopped attending his appointments as he did not have transportation. He would not take advantage of the [facility] transportation service because "[place] [facility] [place]". He has been in some kind of a dispute with them and has been writing his representatives and talking to TV. He apparently is waiting to be included in PTSD treatment again. He reports partial compliance with medication and blames [place] system for not sending his medications on time. This is true for his psychotropic medication as well as medicines prescribed for Crohn's disease. He reports many symptoms of depression with poor sleep, decreased appetite and weight loss, social isolation, anhedonia, feeling of guilt and hopelessness worthlessness, poor energy and concentration, crying spells, and suicidal thinking. The patient frequently feels suicidal but claims that he would never hurt himself except for starting himself to death. He reports poor appetite and abdominal pain from Crohn's disease and believes that it would be no  different if he did not eat. He claims that at some point he did not have any food for 30 days. He does have access to guns but is very department that this will be cooperative. And inappropriate use of arms to kill himself. Recently he called crisis line twice in September where he was brought to the emergency room and discharged to home and again now. On both occasions he initially claims to feel suicidal but then changed his mind. He reports some psychotic symptoms seeing a little better on his door. He denies auditory hallucinations. He appears paranoid at times. He reports heightened anxiety but does not describe any symptoms of PTSD or OCD. He denies alcohol or substance use but was positive for cannabis on admission. He was also positive for benzodiazepines. It is unclear if they were prescribed.  Past psychiatric history. He was [facility] [place]. He has a Therapist, sportspsychiatrist and a therapist at the [place]. He denies ever attempting suicide. The circumstances of his PTSD acquired during [employer] and [employer] service are unclear. He has been tried on multiple medications but does not remember the names. He is quite sure the BuSpar caused serotonin syndrome. He is no longer taking it.  Family psychiatric history. Nonreported.  Social history. He was in Development worker, communitymilitary policeman for 8 years. He is 40 service connected but feels that he is unable to receive services that he needs badly. He lives by himself. He has no support in the community. He has [employer] insurance and [facility] [place] [place] [facility]. The patient has severe Crohn's disease with  frequent bouts of inflammation and chronic abdominal pain. He also believes that her problems caused some changes in his left hip making his leg shorter and causing problems and pain with mobility. He uses a walker here.  Principal Problem: Adjustment disorder with mixed anxiety and depressed  mood Discharge Diagnoses: Patient Active Problem List   Diagnosis Date Noted  . Cannabis use disorder, moderate, dependence (HCC) [F12.20] 08/24/2016  . Suicidal ideation [R45.851] 08/23/2016  . Chronic pain [G89.29] 08/23/2016  . Adjustment disorder with mixed anxiety and depressed mood [F43.23] 06/20/2016  . PTSD (post-traumatic stress disorder) [F43.10] 06/20/2016  . Involuntary commitment [Z04.6] 06/20/2016  . Toothache [K08.89] 06/20/2016  . Crohn's disease (HCC) [K50.90] 12/14/2008    Past Medical History:  Past Medical History:  Diagnosis Date  . Crohn's disease (HCC) 12/2008  . PTSD (post-traumatic stress disorder)    History reviewed. No pertinent surgical history. Family History: History reviewed. No pertinent family history.  Social History:  History  Alcohol Use  . Yes     History  Drug Use  . Types: Marijuana    Social History   Social History  . Marital status: Single    Spouse name: N/A  . Number of children: N/A  . Years of education: N/A   Social History Main Topics  . Smoking status: Current Every Day Smoker    Types: Cigars  . Smokeless tobacco: Never Used  . Alcohol use Yes  . Drug use:     Types: Marijuana  . Sexual activity: Not Asked   Other Topics Concern  . None   Social History Narrative  . None    Hospital Course:    Mr. William Vaughan is a 30 year old male with a history of depression, anxiety, mood instability and substance abuse admitted for suicidal ideation.  1. Suicidal ideation.Resolved.  The patient is able to contract for safety. He is forward thinking and optimistic about the future.   2. Mood. He has been maintained on Cymbalta and Seroquel for depression and mood stabilization.  3. Anxiety. He is on Neurontin, and Vistaril. He claims to have had serotonin syndrome from BuSpar and is no longer taking it.  4. Smoking. Nicotine patch is available.  5. Crohn's disease. She is on sulfasalazine and Humera.   6.  Metabolic syndrome monitoring. Lipid profile, hemoglobin A1c and TSH are normal. PRL 21.5.   7. EKG. Normal EKG.   8. Cannabis abuse. The patient minimizes his problems and declines treatment. He is also positive for bnzodiazepines on admission.   9. Disposition. He was discharged to home. He will follow up with his providers at the [place].  Physical Findings: AIMS:  , ,  ,  ,    CIWA:    COWS:     Musculoskeletal: Strength & Muscle Tone: within normal limits Gait & Station: normal, unsteady Patient leans: N/A  Psychiatric Specialty Exam: Physical Exam  Nursing note and vitals reviewed.   Review of Systems  Gastrointestinal: Positive for abdominal pain.  Musculoskeletal: Positive for joint pain.  Psychiatric/Behavioral: The patient is nervous/anxious.   All other systems reviewed and are negative.   Blood pressure 124/72, pulse 89, temperature 97.9 F (36.6 C), resp. rate 18, height 5\' 11"  (1.803 m), weight 92.5 kg (204 lb), SpO2 98 %.Body mass index is 28.45 kg/m.  General Appearance: Casual  Eye Contact:  Good  Speech:  Clear and Coherent  Volume:  Normal  Mood:  Euthymic  Affect:  Appropriate  Thought Process:  Goal Directed  and Descriptions of Associations: Intact  Orientation:  Full (Time, Place, and Person)  Thought Content:  WDL  Suicidal Thoughts:  No  Homicidal Thoughts:  No  Memory:  Immediate;   Fair Recent;   Fair Remote;   Fair  Judgement:  Fair  Insight:  Fair  Psychomotor Activity:  Normal  Concentration:  Concentration: Fair  Recall:  Fair  Fund of Knowledge:  Fair  Language:  Fair  Akathisia:  No  Handed:  Right  AIMS (if indicated):     Assets:  Communication Skills Desire for Improvement Financial Resources/Insurance Housing Resilience Social Support Transportation  ADL's:  Intact  Cognition:  WNL  Sleep:  Number of Hours: 5     Have you used any form of tobacco in the last 30 days? (Cigarettes, Smokeless Tobacco, Cigars, and/or  Pipes): Yes  Has this patient used any form of tobacco in the last 30 days? (Cigarettes, Smokeless Tobacco, Cigars, and/or Pipes) Yes, Yes, A prescription for an FDA-approved tobacco cessation medication was offered at discharge and the patient refused  Blood Alcohol level:  Lab Results  Component Value Date   [facility] <5 08/22/2016   ETH <5 06/19/2016    Metabolic Disorder Labs:  Lab Results  Component Value Date   HGBA1C 5.1 08/24/2016   MPG 100 08/24/2016   Lab Results  Component Value Date   PROLACTIN 21.5 (H) 08/24/2016   Lab Results  Component Value Date   CHOL 200 08/24/2016   TRIG 193 (H) 08/24/2016   HDL 32 (L) 08/24/2016   CHOLHDL 6.3 08/24/2016   VLDL 39 08/24/2016   LDLCALC 129 (H) 08/24/2016    See Psychiatric Specialty Exam and Suicide Risk Assessment completed by Attending Physician prior to discharge.  Discharge destination:  Home  Is patient on multiple antipsychotic therapies at discharge:  No   Has Patient had three or more failed trials of antipsychotic monotherapy by history:  No  Recommended Plan for Multiple Antipsychotic Therapies: NA  Discharge Instructions    Diet - low sodium heart healthy    Complete by:  As directed    Increase activity slowly    Complete by:  As directed        Medication List    STOP taking these medications   busPIRone 10 MG tablet Commonly known as:  BUSPAR   HYDROcodone-acetaminophen 5-325 MG tablet Commonly known as:  NORCO/VICODIN     TAKE these medications     Indication  cetirizine 10 MG tablet Commonly known as:  ZYRTEC Take 10 mg by mouth daily.  Indication:  Hayfever   cyanocobalamin 1000 MCG tablet Take 1,000 mcg by mouth daily.  Indication:  Inadequate Vitamin B12   dicyclomine 10 MG capsule Commonly known as:  BENTYL Take 10 mg by mouth every 6 (six) hours.  Indication:  Irritable Bowel Syndrome   DULoxetine 60 MG capsule Commonly known as:  CYMBALTA Take 60 mg by mouth 2 (two) times  daily.  Indication:  Major Depressive Disorder   eszopiclone 1 MG Tabs tablet Commonly known as:  LUNESTA Take 3 mg by mouth at bedtime. Take immediately before bedtime  Indication:  Trouble Sleeping   ferrous sulfate 325 (65 FE) MG EC tablet Take 325 mg by mouth daily with breakfast.  Indication:  Iron Deficiency   folic acid 1 MG tablet Commonly known as:  FOLVITE Take 1 mg by mouth daily.  Indication:  Anemia From Inadequate Folic Acid   gabapentin 300 MG capsule Commonly known  as:  NEURONTIN Take 300 mg by mouth 3 (three) times daily.  Indication:  Neurogenic Pain   hydrOXYzine 25 MG capsule Commonly known as:  VISTARIL Take 25 mg by mouth 3 (three) times daily as needed.  Indication:  Anxiety Neurosis   QUEtiapine 200 MG tablet Commonly known as:  SEROQUEL Take 100 mg by mouth at bedtime.  Indication:  Depressive Phase of Manic-Depression   sulfaSALAzine 500 MG tablet Commonly known as:  AZULFIDINE Take 1,000 mg by mouth 2 (two) times daily.  Indication:  Crohn's Disease        Follow-up recommendations:  Activity:  as tolerated. Diet:  reular. Other:  keep follow up appointment.  Comments:    Signed: Kristine Linea, MD 08/25/2016, 8:18 AM

## 2016-08-25 NOTE — Progress Notes (Signed)
Patient ID: William Vaughan, male   DOB: January 18, 1986, 30 y.o.   MRN: [id number] CSW attempted to schedule a hospital follow up appointment with the [facility] within seven days but due to [facility] Day being celebrated by the [place] on the pt's date of discharge on Friday 08/25/16 the clinic that can "overbook", per the [place] phone representative will not be available to reschedule until Monday 08/28/16 at ph: [phone] extension [phone]. CSW informed patient why hospital follow up could not occur until the pt calls and reschedules with his doctor Dr. Caryn Section, or a different doctor until after discharge.  Dorothe Pea. Aime Carreras, LCSWA, LCAS   08/25/16

## 2016-08-25 NOTE — [facility] Suicide Risk Assessment (Signed)
[facility]   Principal Problem: Adjustment disorder with mixed anxiety and depressed mood Discharge Diagnoses:  Patient Active Problem List   Diagnosis Date Noted  . Cannabis use disorder, moderate, dependence (HCC) [F12.20] 08/24/2016  . Suicidal ideation [R45.851] 08/23/2016  . Chronic pain [G89.29] 08/23/2016  . Adjustment disorder with mixed anxiety and depressed mood [F43.23] 06/20/2016  . PTSD (post-traumatic stress disorder) [F43.10] 06/20/2016  . Involuntary commitment [Z04.6] 06/20/2016  . Toothache [K08.89] 06/20/2016  . Crohn's disease (HCC) [K50.90] 12/14/2008    Total Time spent with patient: 30 minutes  Musculoskeletal: Strength & Muscle Tone: within normal limits Gait & Station: unsteady Patient leans: N/A  Psychiatric Specialty Exam: Review of Systems  Gastrointestinal: Positive for abdominal pain.  Musculoskeletal: Positive for joint pain.  Psychiatric/Behavioral: The patient is nervous/anxious.   All other systems reviewed and are negative.   Blood pressure 124/72, pulse 89, temperature 97.9 F (36.6 C), resp. rate 18, height 5\' 11"  (1.803 m), weight 92.5 kg (204 lb), SpO2 98 %.Body mass index is 28.45 kg/m.  General Appearance: Casual  Eye Contact::  Good  Speech:  Clear and Coherent409  Volume:  Normal  Mood:  Euthymic  Affect:  Appropriate  Thought Process:  Goal Directed and Descriptions of Associations: Intact  Orientation:  Full (Time, Place, and Person)  Thought Content:  WDL  Suicidal Thoughts:  No  Homicidal Thoughts:  No  Memory:  Immediate;   Fair Recent;   Fair Remote;   Fair  Judgement:  Impaired  Insight:  Present  Psychomotor Activity:  Normal  Concentration:  Fair  Recall:  [employer]  Fund of Knowledge:Fair  Language: Fair  Akathisia:  No  Handed:  Right  AIMS (if indicated):     Assets:  Communication Skills Desire for Improvement Financial Resources/Insurance Housing Resilience Social  Support Transportation  Sleep:  Number of Hours: 5  Cognition: WNL  ADL's:  Intact   Mental Status Per Nursing Assessment::   On Admission:  NA (Denies)  Demographic Factors:  Male, Caucasian and Living alone  Loss Factors: Decline in physical health and Financial problems/change in socioeconomic status  Historical Factors: Prior suicide attempts, Family history of mental illness or substance abuse and Impulsivity  Risk Reduction Factors:   Sense of responsibility to family, Positive social support and Positive therapeutic relationship  Continued Clinical Symptoms:  Depression:   Impulsivity  Cognitive Features That Contribute To Risk:  None    Suicide Risk:  Minimal: No identifiable suicidal ideation.  Patients presenting with no risk factors but with morbid ruminations; may be classified as minimal risk based on the severity of the depressive symptoms    Plan Of Care/Follow-up recommendations:  Activity:  aas tolerated. Diet:  regular. Other:  keep follow up appointments.  Kristine LineaJolanta Nicolaas Savo, MD 08/25/2016, 8:17 AM

## 2016-08-25 NOTE — [facility] Group Notes (Signed)
[facility] LCSW Group Therapy  08/25/2016 2:01 PM  Type of Therapy:  Group Therapy  Participation Level:  Active  Participation Quality:  Appropriate  Affect:  Appropriate  Cognitive:  Alert  Insight:  Engaged  Engagement in Therapy:  Engaged  Modes of Intervention:  Activity, Discussion, Education and Support  Summary of Progress/Problems:Feelings around Relapse. Group members discussed the meaning of relapse and shared personal stories of relapse, how it affected them and others, and how they perceived themselves during this time. Group members were encouraged to identify triggers, warning signs and coping skills used when facing the possibility of relapse. Social supports were discussed and explored in detail. Patients also discussed facing disappointment and how that can trigger someone to relapse. Patient discussed working with [facility] on his anger issues.    Dally Oshel G. Garnette Czech MSW, LCSWA 08/25/2016, 2:02 PM

## 2016-08-25 NOTE — Progress Notes (Signed)
  [facility] Adult Case Management Discharge Plan :  Will you be returning to the same living situation after discharge:  Yes,  pt will be returning to his home in [place] to live At discharge, do you have transportation home?: Yes,  pt will be picked up by his mother Do you have the ability to pay for your medications: Yes,  pt will be provided with prescriptions at discharge  Release of information consent forms completed and in the chart;  Patient's signature needed at discharge.  Patient to Follow up at: Follow-up Information    [facility] Follow up.   Why:  Due to the [place] celebrating veterans day on 08/25/16, you will need to call ext: [phone] on Mon 08/28/16 for a quicker appt. Your next scheduled appt is January 2nd, at 4:30pm w/ Dr. Caryn Section for medication mgt and on November 22nd 3pm for therapy& on 11/20 @ 11am  Contact information: [facility] (across from [facility]) [address] [place], [place] [address] Ph: [phone] ext [phone] Ext [phone] Fax: [phone]          Next level of care provider has access to [facility] Link:no  Safety Planning and Suicide Prevention discussed: Yes,  completed with pt or No.  Have you used any form of tobacco in the last 30 days? (Cigarettes, Smokeless Tobacco, Cigars, and/or Pipes): Yes  Has patient been referred to the Quitline?: Patient refused referral  Patient has been referred for addiction treatment: Pt refused  Mercy Riding 08/25/2016, 1:32 PM

## 2016-08-25 NOTE — Progress Notes (Signed)
D: Patient appears sad and anxious. States he's here because he's "fighting the [place]." When asked to describe his pain he states "food moving through his scar tissue." Rates his hip pain at a 7. Denies SI/HI/AVH. Patient has been visible in the milieu and attended groups.  A: Medication given with education. Encouragement provided.  R: Patient was compliant with medication. He has been calm and cooperative. Safety maintained with 15 min checks.
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
## 2016-08-25 NOTE — Plan of Care (Signed)
Problem: Safety: Goal: Periods of time without injury will increase Outcome: Progressing Patient has been without injury during this shift.    

## 2016-08-25 NOTE — Progress Notes (Signed)
Patient discharged home. DC instructions provided and explained. Medications reviewed. Rx given. All questions answered. Pt stable at discharge. Denies SI, Hi, AVH. Belongings returned.

## 2016-08-25 NOTE — [facility] Suicide Risk Assessment (Signed)
[facility] INPATIENT:  Family/Significant Other Suicide Prevention Education  Suicide Prevention Education:  Patient Refusal for Family/Significant Other Suicide Prevention Education: The patient William Vaughan has refused to provide written consent for family/significant other to be provided Family/Significant Other Suicide Prevention Education during admission and/or prior to discharge.  Physician notified.  Pt refused SPE from then CSW.   William Vaughan 08/25/2016, 11:59 AM

## 2016-08-25 NOTE — Tx Team (Signed)
Interdisciplinary Treatment and Diagnostic Plan Update  08/25/2016 Time of Session: 10:30am William DaltonBrian V Meskill MRN: [id number]  Principal Diagnosis: Adjustment disorder with mixed anxiety and depressed mood  Secondary Diagnoses: Principal Problem:   Adjustment disorder with mixed anxiety and depressed mood Active Problems:   PTSD (post-traumatic stress disorder)   Involuntary commitment   Crohn's disease (HCC)   Suicidal ideation   Chronic pain   Cannabis use disorder, moderate, dependence (HCC)   Current Medications:  Current Facility-Administered Medications  Medication Dose Route Frequency Provider Last Rate Last Dose  . acetaminophen (TYLENOL) tablet 650 mg  650 mg Oral Q6H PRN Audery AmelJohn T Clapacs, MD      . alum & mag hydroxide-simeth (MAALOX/MYLANTA) 200-200-20 MG/5ML suspension 30 mL  30 mL Oral Q4H PRN Audery AmelJohn T Clapacs, MD      . DULoxetine (CYMBALTA) DR capsule 60 mg  60 mg Oral BID Audery AmelJohn T Clapacs, MD   60 mg at 08/25/16 0842  . gabapentin (NEURONTIN) capsule 300 mg  300 mg Oral TID Audery AmelJohn T Clapacs, MD   300 mg at 08/25/16 1204  . hydrOXYzine (VISTARIL) capsule 25 mg  25 mg Oral TID PRN Audery AmelJohn T Clapacs, MD   25 mg at 08/23/16 2309  . loratadine (CLARITIN) tablet 10 mg  10 mg Oral Daily Audery AmelJohn T Clapacs, MD   10 mg at 08/25/16 0842  . magnesium hydroxide (MILK OF MAGNESIA) suspension 30 mL  30 mL Oral Daily PRN Audery AmelJohn T Clapacs, MD      . nicotine (NICODERM CQ - dosed in mg/24 hours) patch 21 mg  21 mg Transdermal Daily Jolanta B Pucilowska, MD   21 mg at 08/25/16 0842  . QUEtiapine (SEROQUEL) tablet 100 mg  100 mg Oral QHS Audery AmelJohn T Clapacs, MD   100 mg at 08/24/16 2127  . sulfaSALAzine (AZULFIDINE) tablet 1,000 mg  1,000 mg Oral BID Audery AmelJohn T Clapacs, MD   1,000 mg at 08/25/16 0842  . vitamin B-12 (CYANOCOBALAMIN) tablet 1,000 mcg  1,000 mcg Oral Daily Shari ProwsJolanta B Pucilowska, MD   1,000 mcg at 08/25/16 [id number]   PTA Medications: Prescriptions Prior to Admission  Medication Sig Dispense Refill Last  Dose  . busPIRone (BUSPAR) 10 MG tablet Take 10 mg by mouth 2 (two) times daily.   08/22/2016 at Unknown time  . cetirizine (ZYRTEC) 10 MG tablet Take 10 mg by mouth daily.   08/22/2016 at Unknown time  . cyanocobalamin 1000 MCG tablet Take 1,000 mcg by mouth daily.   08/22/2016 at Unknown time  . dicyclomine (BENTYL) 10 MG capsule Take 10 mg by mouth every 6 (six) hours.     . DULoxetine (CYMBALTA) 60 MG capsule Take 60 mg by mouth 2 (two) times daily.   08/22/2016 at Unknown time  . eszopiclone (LUNESTA) 1 MG TABS tablet Take 3 mg by mouth at bedtime. Take immediately before bedtime     . ferrous sulfate 325 (65 FE) MG EC tablet Take 325 mg by mouth daily with breakfast.     . folic acid (FOLVITE) 1 MG tablet Take 1 mg by mouth daily.   08/22/2016 at Unknown time  . gabapentin (NEURONTIN) 300 MG capsule Take 300 mg by mouth 3 (three) times daily.   08/22/2016 at Unknown time  . HYDROcodone-acetaminophen (NORCO/VICODIN) 5-325 MG tablet Take 1 tablet by mouth every 6 (six) hours as needed for moderate pain.     . hydrOXYzine (VISTARIL) 25 MG capsule Take 25 mg by mouth 3 (three) times daily as  needed.   08/22/2016 at Unknown time  . QUEtiapine (SEROQUEL) 200 MG tablet Take 100 mg by mouth at bedtime.   Past Week at Unknown time  . sulfaSALAzine (AZULFIDINE) 500 MG tablet Take 1,000 mg by mouth 2 (two) times daily.   08/22/2016 at Unknown time    Patient Stressors: Financial difficulties Health problems Marital or family conflict Other: "the [place]"  Patient Strengths: Active sense of humor Average or above average intelligence Communication skills  Treatment Modalities: Medication Management, Group therapy, Case management,  1 to 1 session with clinician, Psychoeducation, Recreational therapy.   Physician Treatment Plan for Primary Diagnosis: Adjustment disorder with mixed anxiety and depressed mood Long Term Goal(s): Improvement in symptoms so as ready for discharge Improvement in symptoms so as  ready for discharge   Short Term Goals: Ability to identify changes in lifestyle to reduce recurrence of condition will improve Ability to verbalize feelings will improve Ability to disclose and discuss suicidal ideas Ability to demonstrate self-control will improve Ability to identify and develop effective coping behaviors will improve Ability to maintain clinical measurements within normal limits will improve Compliance with prescribed medications will improve Ability to identify changes in lifestyle to reduce recurrence of condition will improve Ability to demonstrate self-control will improve Ability to identify triggers associated with substance abuse/mental health issues will improve  Medication Management: Evaluate patient's response, side effects, and tolerance of medication regimen.  Therapeutic Interventions: 1 to 1 sessions, Unit Group sessions and Medication administration.  Evaluation of Outcomes: Progressing  Physician Treatment Plan for Secondary Diagnosis: Principal Problem:   Adjustment disorder with mixed anxiety and depressed mood Active Problems:   PTSD (post-traumatic stress disorder)   Involuntary commitment   Crohn's disease (HCC)   Suicidal ideation   Chronic pain   Cannabis use disorder, moderate, dependence (HCC)  Long Term Goal(s): Improvement in symptoms so as ready for discharge Improvement in symptoms so as ready for discharge   Short Term Goals: Ability to identify changes in lifestyle to reduce recurrence of condition will improve Ability to verbalize feelings will improve Ability to disclose and discuss suicidal ideas Ability to demonstrate self-control will improve Ability to identify and develop effective coping behaviors will improve Ability to maintain clinical measurements within normal limits will improve Compliance with prescribed medications will improve Ability to identify changes in lifestyle to reduce recurrence of condition will  improve Ability to demonstrate self-control will improve Ability to identify triggers associated with substance abuse/mental health issues will improve     Medication Management: Evaluate patient's response, side effects, and tolerance of medication regimen.  Therapeutic Interventions: 1 to 1 sessions, Unit Group sessions and Medication administration.  Evaluation of Outcomes: Adequate for discharge   RN Treatment Plan for Primary Diagnosis: Adjustment disorder with mixed anxiety and depressed mood Long Term Goal(s): Knowledge of disease and therapeutic regimen to maintain health will improve  Short Term Goals: Ability to remain free from injury will improve, Ability to participate in decision making will improve, Ability to verbalize feelings will improve, Ability to disclose and discuss suicidal ideas and Ability to identify and develop effective coping behaviors will improve  Medication Management: RN will administer medications as ordered by provider, will assess and evaluate patient's response and provide education to patient for prescribed medication. RN will report any adverse and/or side effects to prescribing provider.  Therapeutic Interventions: 1 on 1 counseling sessions, Psychoeducation, Medication administration, Evaluate responses to treatment, Monitor vital signs and CBGs as ordered, Perform/monitor CIWA, COWS, AIMS and  Fall Risk screenings as ordered, Perform wound care treatments as ordered.  Evaluation of Outcomes: Adequate for discharge   LCSW Treatment Plan for Primary Diagnosis: Adjustment disorder with mixed anxiety and depressed mood Long Term Goal(s): Safe transition to appropriate next level of care at discharge, Engage patient in therapeutic group addressing interpersonal concerns.  Short Term Goals: Engage patient in aftercare planning with referrals and resources, Increase social support, Increase ability to appropriately verbalize feelings, Increase emotional  regulation, Facilitate acceptance of mental health diagnosis and concerns, Facilitate patient progression through stages of change regarding substance use diagnoses and concerns, Identify triggers associated with mental health/substance abuse issues and Increase skills for wellness and recovery  Therapeutic Interventions: Assess for all discharge needs, 1 to 1 time with Social worker, Explore available resources and support systems, Assess for adequacy in community support network, Educate family and significant other(s) on suicide prevention, Complete Psychosocial Assessment, Interpersonal group therapy.  Evaluation of Outcomes: Adequate for discharge   Progress in Treatment: Attending groups: No. CSW still assessing, pt new to milieu Participating in groups: No.  CSW still assessing, pt new to milieu  Taking medication as prescribed: Yes. Toleration medication: No. Family/Significant other contact made: No, will contact:  CSW still assessing  Patient understands diagnosis: Yes. Discussing patient identified problems/goals with staff: Yes. Medical problems stabilized or resolved: Yes. Denies suicidal/homicidal ideation: Yes. Issues/concerns per patient self-inventory: No. Other: none listed  New problem(s) identified: No, Describe:  none reported  New Short Term/Long Term Goal(s): None listed  Discharge Plan or Barriers: Pt will Pt will discharge home [facility] and [facility], substance abuse treatment and therapy.   Reason for Continuation of Hospitalization: Anxiety Depression Hallucinations Suicidal ideation Withdrawal symptoms  Estimated Length of Stay:  Attendees: Patient: [facility] Patient:[place] Barbaraann Boys 08/24/2016 10:46 AM  Physician: Dr. Jennet Maduro, MD 08/24/2016 10:46 AM  Nursing: Elenore Paddy, RN 08/24/2016 10:46 AM  RN Care Manager: 08/24/2016 10:46 AM  Social Worker: Dorothe Pea. Roylene Reason, LCAS 08/24/2016 10:46 AM  Recreational Therapist: Hershal Coria, LRT 08/24/2016 10:46 AM  Other:  08/24/2016 10:46 AM  Other:  08/24/2016 10:46 AM  Other: 08/24/2016 10:46 AM    Scribe for Treatment Team: Dorothe Pea Lynea Rollison, LCSWA 08/24/2016 10:46 AM  *Updated 08/25/16  Dorothe Pea. Arleth Mccullar, LCSWA, LCAS

## 2018-12-11 ENCOUNTER — Emergency Department
Admission: EM | Admit: 2018-12-11 | Discharge: 2018-12-12 | Disposition: A | Discharge status: Home / Self Care | Attending: Emergency Medicine | Admitting: Emergency Medicine

## 2018-12-11 ENCOUNTER — Other Ambulatory Visit: Payer: Self-pay

## 2018-12-11 DIAGNOSIS — F17290 Nicotine dependence, other tobacco product, uncomplicated: Secondary | ICD-10-CM | POA: Diagnosis not present

## 2018-12-11 DIAGNOSIS — K625 Hemorrhage of anus and rectum: Secondary | ICD-10-CM | POA: Diagnosis not present

## 2018-12-11 DIAGNOSIS — R197 Diarrhea, unspecified: Secondary | ICD-10-CM | POA: Diagnosis not present

## 2018-12-11 DIAGNOSIS — Z79899 Other long term (current) drug therapy: Secondary | ICD-10-CM | POA: Diagnosis not present

## 2018-12-11 DIAGNOSIS — R1030 Lower abdominal pain, unspecified: Secondary | ICD-10-CM

## 2018-12-11 DIAGNOSIS — R509 Fever, unspecified: Secondary | ICD-10-CM | POA: Diagnosis not present

## 2018-12-11 LAB — COMPREHENSIVE METABOLIC PANEL
ALT: 42 U/L (ref 0–44)
AST: 30 U/L (ref 15–41)
Albumin: 4.6 g/dL (ref 3.5–5.0)
Alkaline Phosphatase: 128 U/L — ABNORMAL HIGH (ref 38–126)
Anion gap: 11 (ref 5–15)
BUN: 11 mg/dL (ref 6–20)
CO2: 24 mmol/L (ref 22–32)
Calcium: 9.4 mg/dL (ref 8.9–10.3)
Chloride: 104 mmol/L (ref 98–111)
Creatinine, Ser: 1.14 mg/dL (ref 0.61–1.24)
Glucose, Bld: 111 mg/dL — ABNORMAL HIGH (ref 70–99)
Potassium: 3.8 mmol/L (ref 3.5–5.1)
Sodium: 139 mmol/L (ref 135–145)
Total Bilirubin: 0.6 mg/dL (ref 0.3–1.2)
Total Protein: 8 g/dL (ref 6.5–8.1)

## 2018-12-11 LAB — CBC WITH DIFFERENTIAL/PLATELET
Abs Immature Granulocytes: 0.03 10*3/uL (ref 0.00–0.07)
Basophils Absolute: 0 10*3/uL (ref 0.0–0.1)
Basophils Relative: 0 %
Eosinophils Absolute: 0.1 10*3/uL (ref 0.0–0.5)
Eosinophils Relative: 1 %
HCT: 46.7 % (ref 39.0–52.0)
Hemoglobin: 15.3 g/dL (ref 13.0–17.0)
Immature Granulocytes: 0 %
Lymphocytes Relative: 19 %
Lymphs Abs: 1.9 10*3/uL (ref 0.7–4.0)
MCH: 27.5 pg (ref 26.0–34.0)
MCHC: 32.8 g/dL (ref 30.0–36.0)
MCV: 83.8 fL (ref 80.0–100.0)
MONO ABS: 0.9 10*3/uL (ref 0.1–1.0)
Monocytes Relative: 9 %
Neutro Abs: 7.1 10*3/uL (ref 1.7–7.7)
Neutrophils Relative %: 71 %
Platelets: 392 10*3/uL (ref 150–400)
RBC: 5.57 MIL/uL (ref 4.22–5.81)
RDW: 12.2 % (ref 11.5–15.5)
WBC: 10.1 10*3/uL (ref 4.0–10.5)
nRBC: 0 % (ref 0.0–0.2)

## 2018-12-11 LAB — LIPASE, BLOOD: LIPASE: 27 U/L (ref 11–51)

## 2018-12-11 MED ORDER — SODIUM CHLORIDE 0.9 % IV BOLUS
1000.0000 mL | Freq: Once | INTRAVENOUS | Status: AC
  Administered 2018-12-11: 1000 mL via INTRAVENOUS

## 2018-12-11 MED ORDER — DICYCLOMINE HCL 20 MG PO TABS: 20.0000 mg | ORAL_TABLET | Freq: Three times a day (TID) | ORAL | 0 refills | Status: DC | PRN

## 2018-12-11 MED ORDER — HALOPERIDOL LACTATE 5 MG/ML IJ SOLN
5.0000 mg | Freq: Once | INTRAMUSCULAR | Status: AC
  Administered 2018-12-11: 5 mg via INTRAVENOUS
  Filled 2018-12-11: qty 1

## 2018-12-11 NOTE — ED Notes (Signed)
Reports received from [place], [place], Pt care assumed at this time.

## 2018-12-11 NOTE — ED Provider Notes (Signed)
[facility] Emergency Department Provider Note  ____________________________________________   I have reviewed the triage vital signs and the nursing notes.   HISTORY  Chief Complaint Rectal Bleeding and Abdominal Pain   History limited by: Not Limited   HPI William Vaughan is a 33 y.o. male who presents to the emergency department today because of concerns for lower abdominal pain, diarrhea and rectal bleeding.  Patient states the symptoms started today.  He has had multiple episodes of diarrhea.  States he has noticed some blood in it.  This has been accompanied by lower abdominal pain.  He describes it as sharp.  He describes it as severe.  At has been constant since it started.  He states he has a history of Crohn's but the pain is never been this bad before.  Patient has had subjective fevers.   Per medical record review patient has a history of crohn's disease  Past Medical History:  Diagnosis Date  . Crohn's disease ([place]) 12/2008  . PTSD (post-traumatic stress disorder)     Patient Active Problem List   Diagnosis Date Noted  . Cannabis use disorder, moderate, dependence ([place]) 08/24/2016  . Suicidal ideation 08/23/2016  . Chronic pain 08/23/2016  . Adjustment disorder with mixed anxiety and depressed mood 06/20/2016  . PTSD (post-traumatic stress disorder) 06/20/2016  . Involuntary commitment 06/20/2016  . Toothache 06/20/2016  . Crohn's disease ([place]) 12/14/2008    No past surgical history on file.  Prior to Admission medications   Medication Sig Start Date End Date Taking? Authorizing Provider  cetirizine (ZYRTEC) 10 MG tablet Take 10 mg by mouth daily.    [provider]  cyanocobalamin 1000 MCG tablet Take 1,000 mcg by mouth daily.    [provider]  dicyclomine (BENTYL) 10 MG capsule Take 10 mg by mouth every 6 (six) hours.    [provider]  DULoxetine (CYMBALTA) 60 MG capsule Take 60 mg by mouth 2 (two) times  daily.    [provider]  eszopiclone (LUNESTA) 1 MG TABS tablet Take 3 mg by mouth at bedtime. Take immediately before bedtime    [provider]  ferrous sulfate 325 (65 FE) MG EC tablet Take 325 mg by mouth daily with breakfast.    [provider]  folic acid (FOLVITE) 1 MG tablet Take 1 mg by mouth daily.    [provider]  gabapentin (NEURONTIN) 300 MG capsule Take 300 mg by mouth 3 (three) times daily.    [provider]  hydrOXYzine (VISTARIL) 25 MG capsule Take 25 mg by mouth 3 (three) times daily as needed.    [provider]  QUEtiapine (SEROQUEL) 200 MG tablet Take 100 mg by mouth at bedtime.    [provider]  sulfaSALAzine (AZULFIDINE) 500 MG tablet Take 1,000 mg by mouth 2 (two) times daily.    [provider]    Allergies Patient has no known allergies.  No family history on file.  Social History Social History   Tobacco Use  . Smoking status: Current Every Day Smoker    Types: Cigars  . Smokeless tobacco: Never Used  Substance Use Topics  . Alcohol use: Yes  . Drug use: Yes    Types: Marijuana    Review of Systems Constitutional: Positive for subjective fever.  Eyes: No visual changes. ENT: No sore throat. Cardiovascular: Denies chest pain. Respiratory: Denies shortness of breath. Gastrointestinal: Positive for abdominal pain, diarrhea.  Genitourinary: Negative for dysuria. Musculoskeletal:  Negative for back pain. Skin: Negative for rash. Neurological: Negative for headaches, focal weakness or numbness.  ____________________________________________   PHYSICAL EXAM:  VITAL SIGNS: ED Triage Vitals  Enc Vitals Group     BP 12/11/18 2052 (!) 141/104     Pulse Rate 12/11/18 2052 98     Resp 12/11/18 2052 (!) 28     Temp 12/11/18 2052 100.3 F (37.9 C)     Temp Source 12/11/18 2052 Oral     SpO2 12/11/18 2052 100 %     Weight 12/11/18 2053 200 lb (90.7 kg)     Height 12/11/18  2053 5' 11" (1.803 m)     Head Circumference --      Peak Flow --      Pain Score 12/11/18 2052 9   Constitutional: Alert and oriented.  Eyes: Conjunctivae are normal.  ENT      Head: Normocephalic and atraumatic.      Nose: No congestion/rhinnorhea.      Mouth/Throat: Mucous membranes are moist.      Neck: No stridor. Hematological/Lymphatic/Immunilogical: No cervical lymphadenopathy. Cardiovascular: Normal rate, regular rhythm.  No murmurs, rubs, or gallops.  Respiratory: Normal respiratory effort without tachypnea nor retractions. Breath sounds are clear and equal bilaterally. No wheezes/rales/rhonchi. Gastrointestinal: Soft and tender in the lower abdomen. No distention.  Genitourinary: Deferred Musculoskeletal: Normal range of motion in all extremities.  Neurologic:  Normal speech and language. No gross focal neurologic deficits are appreciated.  Skin:  Skin is warm, dry and intact. No rash noted. Psychiatric: Mood and affect are normal. Speech and behavior are normal. Patient exhibits appropriate insight and judgment.  ____________________________________________    LABS (pertinent positives/negatives)  Lipase 27 CBC wbc 10.1, hgb 15.3, plt 392 CMP wnl except glu 111, alk phos 128  ____________________________________________   EKG  None  ____________________________________________    RADIOLOGY  None  ____________________________________________   PROCEDURES  Procedures  ____________________________________________   INITIAL IMPRESSION / ASSESSMENT AND PLAN / ED COURSE  Pertinent labs & imaging results that were available during my care of the patient were reviewed by me and considered in my medical decision making (see chart for details).   Patient presented to the emergency department today because of concerns for lower abdominal pain, diarrhea and possible blood in the diarrhea.  Patient did have one episode of diarrhea here and I observed it in  the bowl without any apparent bleeding.  He was mildly tender in the lower abdomen however blood work without any concerning leukocytosis.  Patient was given Haldol with good relief of his pain.  At this point I think Crohn's flare possible versus gastroenteritis.  Patient did feel comfortable going home at this point.  Will discharge with Bentyl.  Patient states he has Zofran at home.  Discussed importance of follow-up with his GI doctor.   ____________________________________________   FINAL CLINICAL IMPRESSION(S) / ED DIAGNOSES  Final diagnoses:  Lower abdominal pain  Diarrhea, unspecified type     Note: This dictation was prepared with Dragon dictation. Any transcriptional errors that result from this process are unintentional     Nance Pear, MD 12/11/18 2348

## 2018-12-11 NOTE — ED Triage Notes (Signed)
Rectal bleeding. Reports bloody diarrhea x 7 episodes tonight. Patient reports taking ABX for dental problems.

## 2018-12-11 NOTE — ED Notes (Signed)
Patient oob to commode loose stools noted  Brown in colr, no obvious blood noted in his stools. md made aware. C/o of pain, hat placed on commode for stoll specimen, r//o c-diff.

## 2018-12-11 NOTE — ED Notes (Signed)
Poison control called

## 2018-12-11 NOTE — Discharge Instructions (Addendum)
Please seek medical attention for any high fevers, chest pain, shortness of breath, change in behavior, persistent vomiting, bloody stool or any other new or concerning symptoms.  

## 2018-12-12 NOTE — ED Notes (Signed)
Pt verbalized understanding of d/c instructions, rx and f/u care. No further questions at this time. Pt ambulatory to exit with steady gait to wait for ride home.

## 2020-09-09 ENCOUNTER — Emergency Department

## 2020-09-09 ENCOUNTER — Emergency Department
Admission: EM | Admit: 2020-09-09 | Discharge: 2020-09-09 | Disposition: A | Discharge status: Home / Self Care | Attending: Emergency Medicine | Admitting: Emergency Medicine

## 2020-09-09 ENCOUNTER — Encounter: Payer: Self-pay | Admitting: Emergency Medicine

## 2020-09-09 ENCOUNTER — Other Ambulatory Visit: Payer: Self-pay

## 2020-09-09 DIAGNOSIS — S92335A Nondisplaced fracture of third metatarsal bone, left foot, initial encounter for closed fracture: Secondary | ICD-10-CM | POA: Diagnosis not present

## 2020-09-09 DIAGNOSIS — W010XXA Fall on same level from slipping, tripping and stumbling without subsequent striking against object, initial encounter: Secondary | ICD-10-CM | POA: Diagnosis not present

## 2020-09-09 DIAGNOSIS — K625 Hemorrhage of anus and rectum: Secondary | ICD-10-CM | POA: Insufficient documentation

## 2020-09-09 DIAGNOSIS — S8265XA Nondisplaced fracture of lateral malleolus of left fibula, initial encounter for closed fracture: Secondary | ICD-10-CM | POA: Insufficient documentation

## 2020-09-09 DIAGNOSIS — K50911 Crohn's disease, unspecified, with rectal bleeding: Secondary | ICD-10-CM | POA: Insufficient documentation

## 2020-09-09 DIAGNOSIS — F17290 Nicotine dependence, other tobacco product, uncomplicated: Secondary | ICD-10-CM | POA: Diagnosis not present

## 2020-09-09 DIAGNOSIS — S92325A Nondisplaced fracture of second metatarsal bone, left foot, initial encounter for closed fracture: Secondary | ICD-10-CM | POA: Diagnosis not present

## 2020-09-09 DIAGNOSIS — S93402A Sprain of unspecified ligament of left ankle, initial encounter: Secondary | ICD-10-CM | POA: Diagnosis present

## 2020-09-09 LAB — COMPREHENSIVE METABOLIC PANEL
ALT: 30 U/L (ref 0–44)
AST: 31 U/L (ref 15–41)
Albumin: 3.4 g/dL — ABNORMAL LOW (ref 3.5–5.0)
Alkaline Phosphatase: 131 U/L — ABNORMAL HIGH (ref 38–126)
Anion gap: 12 (ref 5–15)
BUN: 20 mg/dL (ref 6–20)
CO2: 17 mmol/L — ABNORMAL LOW (ref 22–32)
Calcium: 8.7 mg/dL — ABNORMAL LOW (ref 8.9–10.3)
Chloride: 109 mmol/L (ref 98–111)
Creatinine, Ser: 1.54 mg/dL — ABNORMAL HIGH (ref 0.61–1.24)
GFR, Estimated: 60 mL/min — ABNORMAL LOW (ref >60)
Glucose, Bld: 118 mg/dL — ABNORMAL HIGH (ref 70–99)
Potassium: 3.4 mmol/L — ABNORMAL LOW (ref 3.5–5.1)
Sodium: 138 mmol/L (ref 135–145)
Total Bilirubin: 0.4 mg/dL (ref 0.3–1.2)
Total Protein: 6.8 g/dL (ref 6.5–8.1)

## 2020-09-09 LAB — CBC WITH DIFFERENTIAL/PLATELET
Abs Immature Granulocytes: 0.17 10*3/uL — ABNORMAL HIGH (ref 0.00–0.07)
Basophils Absolute: 0.1 10*3/uL (ref 0.0–0.1)
Basophils Relative: 1 %
Eosinophils Absolute: 0.7 10*3/uL — ABNORMAL HIGH (ref 0.0–0.5)
Eosinophils Relative: 3 %
HCT: 38.2 % — ABNORMAL LOW (ref 39.0–52.0)
Hemoglobin: 12.2 g/dL — ABNORMAL LOW (ref 13.0–17.0)
Immature Granulocytes: 1 %
Lymphocytes Relative: 13 %
Lymphs Abs: 2.9 10*3/uL (ref 0.7–4.0)
MCH: 27.4 pg (ref 26.0–34.0)
MCHC: 31.9 g/dL (ref 30.0–36.0)
MCV: 85.8 fL (ref 80.0–100.0)
Monocytes Absolute: 1.6 10*3/uL — ABNORMAL HIGH (ref 0.1–1.0)
Monocytes Relative: 7 %
Neutro Abs: 16.5 10*3/uL — ABNORMAL HIGH (ref 1.7–7.7)
Neutrophils Relative %: 75 %
Platelets: 520 10*3/uL — ABNORMAL HIGH (ref 150–400)
RBC: 4.45 MIL/uL (ref 4.22–5.81)
RDW: 12.9 % (ref 11.5–15.5)
WBC: 22 10*3/uL — ABNORMAL HIGH (ref 4.0–10.5)
nRBC: 0 % (ref 0.0–0.2)

## 2020-09-09 MED ORDER — PREDNISONE 20 MG PO TABS
40.0000 mg | ORAL_TABLET | Freq: Once | ORAL | Status: AC
  Administered 2020-09-09: 40 mg via ORAL
  Filled 2020-09-09: qty 2

## 2020-09-09 MED ORDER — LACTATED RINGERS IV BOLUS
1000.0000 mL | Freq: Once | INTRAVENOUS | Status: AC
  Administered 2020-09-09: 1000 mL via INTRAVENOUS

## 2020-09-09 MED ORDER — PREDNISONE 10 MG (21) PO TBPK: ORAL_TABLET | ORAL | 0 refills | Status: AC

## 2020-09-09 MED ORDER — ACETAMINOPHEN 500 MG PO TABS
1000.0000 mg | ORAL_TABLET | Freq: Once | ORAL | Status: AC
  Administered 2020-09-09: 1000 mg via ORAL
  Filled 2020-09-09: qty 2

## 2020-09-09 NOTE — ED Provider Notes (Signed)
[facility] Emergency Department Provider Note ____________________________________________   First MD Initiated Contact with Patient 09/09/20 [phone]     (approximate)  I have reviewed the triage vital signs and the nursing notes.  HISTORY  Chief Complaint GI Bleeding   HPI William Vaughan is a 34 y.o. malewho presents to the ED for evaluation of hematochezia.   Chart review indicates hx of PTSD and Crohn's disease.  Patient reports recently being established with GI through the [place].  He reports being on Humira previously, stopped 1 year ago and he is currently not on controller medications for his Crohn's disease.  Denies recent steroids or antibiotics.  Patient reports being in his typical state of health until developing 2 episodes of bloody stool over the past 12 hours.  Reports one episode last night at about 11 PM of large-volume bright red blood.  Reports another episode this morning of similar.  He reports associated presyncopal lightheaded dizziness and a stumbling this morning where he twisted his left ankle, which precipitated him to call 911 for evaluation.  Denies syncope, head trauma, emesis, fever, chest pain, shortness of breath, dysuria.  Reports chronic left-sided abdominal pain that is been present for a decade without acute worsening.   Past Medical History:  Diagnosis Date  . Crohn's disease (HCC) 12/2008  . PTSD (post-traumatic stress disorder)     Patient Active Problem List   Diagnosis Date Noted  . Cannabis use disorder, moderate, dependence (HCC) 08/24/2016  . Suicidal ideation 08/23/2016  . Chronic pain 08/23/2016  . Adjustment disorder with mixed anxiety and depressed mood 06/20/2016  . PTSD (post-traumatic stress disorder) 06/20/2016  . Involuntary commitment 06/20/2016  . Toothache 06/20/2016  . Crohn's disease (HCC) 12/14/2008    History reviewed. No pertinent surgical history.  Prior to Admission medications   Medication  Sig Start Date End Date Taking? Authorizing Provider  cetirizine (ZYRTEC) 10 MG tablet Take 10 mg by mouth daily.    [provider]  cyanocobalamin 1000 MCG tablet Take 1,000 mcg by mouth daily.    [provider]  dicyclomine (BENTYL) 10 MG capsule Take 10 mg by mouth every 6 (six) hours.    [provider]  dicyclomine (BENTYL) 20 MG tablet Take 1 tablet (20 mg total) by mouth 3 (three) times daily as needed (abdominal pain). 12/11/18   Phineas Semen, MD  DULoxetine (CYMBALTA) 60 MG capsule Take 60 mg by mouth 2 (two) times daily.    [provider]  eszopiclone (LUNESTA) 1 MG TABS tablet Take 3 mg by mouth at bedtime. Take immediately before bedtime    [provider]  ferrous sulfate 325 (65 FE) MG EC tablet Take 325 mg by mouth daily with breakfast.    [provider]  folic acid (FOLVITE) 1 MG tablet Take 1 mg by mouth daily.    [provider]  gabapentin (NEURONTIN) 300 MG capsule Take 300 mg by mouth 3 (three) times daily.    [provider]  hydrOXYzine (VISTARIL) 25 MG capsule Take 25 mg by mouth 3 (three) times daily as needed.    [provider]  predniSONE (STERAPRED UNI-PAK 21 TAB) 10 MG (21) TBPK tablet Take 4 tablets (40 mg total) by mouth daily for 6 days, THEN 3 tablets (30 mg total) daily for 7 days, THEN 2 tablets (20 mg total) daily for 7 days, THEN 1 tablet (10 mg total) daily for 7 days. 09/09/20 10/06/20  Delton Prairie, MD  QUEtiapine (SEROQUEL) 200 MG tablet Take 100 mg by mouth at bedtime.    [provider]  sulfaSALAzine (AZULFIDINE) 500 MG tablet Take 1,000 mg by mouth 2 (two) times daily.    [provider]    Allergies Diltiazem  History reviewed. No pertinent family history.  Social History Social History   Tobacco Use  . Smoking status: Current Every Day Smoker    Types: Cigars  . Smokeless tobacco: Never Used  Substance Use Topics  . Alcohol use: Yes  .  Drug use: Not Currently    Review of Systems  Constitutional: No fever/chills Eyes: No visual changes. ENT: No sore throat. Cardiovascular: Denies chest pain. Respiratory: Denies shortness of breath. Gastrointestinal: Positive for chronic abdominal pain.  Positive for hematochezia.   No nausea, no vomiting.  No constipation. Genitourinary: Negative for dysuria. Musculoskeletal: Negative for back pain. Skin: Negative for rash. Neurological: Negative for headaches, focal weakness or numbness.  ____________________________________________   PHYSICAL EXAM:  VITAL SIGNS: Vitals:   09/09/20 0729  BP: (!) 116/59  Pulse: 77  Temp: 98.1 F (36.7 C)  SpO2: 100%      Constitutional: Alert and oriented. Well appearing and in no acute distress.  Conversational in full sentences.  Able to move himself over from EMS stretcher to our stretcher. Eyes: Conjunctivae are normal. PERRL. EOMI. Head: Atraumatic. Nose: No congestion/rhinnorhea. Mouth/Throat: Mucous membranes are moist.  Oropharynx non-erythematous. Neck: No stridor. No cervical spine tenderness to palpation. Cardiovascular: Normal rate, regular rhythm. Grossly normal heart sounds.  Good peripheral circulation. Respiratory: Normal respiratory effort.  No retractions. Lungs CTAB. Gastrointestinal: Soft , nondistended. No CVA tenderness.  Minimal left-sided tenderness to palpation without peritoneal or localizing features. Musculoskeletal:  No joint effusions.  No external signs of trauma, but tenderness to palpation to the lateral aspect of the left ankle and tenderness to the foot with AP squeezing/compression to the third metatarsal.  Distally neurovascularly intact. Neurologic:  Normal speech and language. No gross focal neurologic deficits are appreciated. No gait instability noted. Skin:  Skin is warm, dry and intact. No rash noted. Psychiatric: Mood and affect are normal. Speech and behavior are  normal.  ____________________________________________   LABS (all labs ordered are listed, but only abnormal results are displayed)  Labs Reviewed  CBC WITH DIFFERENTIAL/PLATELET - Abnormal; Notable for the following components:      Result Value   WBC 22.0 (*)    Hemoglobin 12.2 (*)    HCT 38.2 (*)    Platelets 520 (*)    Neutro Abs 16.5 (*)    Monocytes Absolute 1.6 (*)    Eosinophils Absolute 0.7 (*)    Abs Immature Granulocytes 0.17 (*)    All other components within normal limits  COMPREHENSIVE METABOLIC PANEL - Abnormal; Notable for the following components:   Potassium 3.4 (*)    CO2 17 (*)    Glucose, Bld 118 (*)    Creatinine, Ser 1.54 (*)    Calcium 8.7 (*)    Albumin 3.4 (*)    Alkaline Phosphatase 131 (*)    GFR, Estimated 60 (*)    All other components within normal limits  URINALYSIS, COMPLETE (UACMP) WITH MICROSCOPIC   ____________________________________________  RADIOLOGY  ED MD interpretation: Plain films of the left ankle and foot reviewed by me significant for nondisplaced fractures of his left distal fibula and the base of the second and third metatarsals.  Official radiology report(s): DG Ankle Complete Left  Result Date: 09/09/2020 CLINICAL DATA:  Twisted ankle. EXAM: LEFT ANKLE COMPLETE - 3+ VIEW COMPARISON:  None. FINDINGS: There is an acute obliquely oriented fracture deformity involving the distal fibula. The fracture fragments are in near anatomic alignment. Acute, nondisplaced fractures involving the base of the second and third metatarsal bones also noted. IMPRESSION: 1. Acute fracture involves the distal fibula, and base of second and third metatarsal bones. Electronically Signed   By: Signa Kell M.D.   On: 09/09/2020 08:19   DG Foot Complete Left  Result Date: 09/09/2020 CLINICAL DATA:  Twisted ankle.  Tenderness over third metatarsal. EXAM: LEFT FOOT - COMPLETE 3+ VIEW COMPARISON:  None. FINDINGS: Acute, nondisplaced fractures are  noted involving the base of the second and third metatarsal bones. Fracture fragments are in anatomic alignment. Obliquely oriented fracture involving the distal fibula is also noted with fracture fragments in near anatomic alignment IMPRESSION: 1. Acute, nondisplaced fractures involve the base of the second and third metatarsal bones. 2. Acute fracture involves the distal fibula. Electronically Signed   By: Signa Kell M.D.   On: 09/09/2020 08:21    ____________________________________________   PROCEDURES and INTERVENTIONS  Procedure(s) performed (including Critical Care):  Procedures  Medications  acetaminophen (TYLENOL) tablet 1,000 mg (1,000 mg Oral Given 09/09/20 0754)  predniSONE (DELTASONE) tablet 40 mg (40 mg Oral Given 09/09/20 0754)  lactated ringers bolus 1,000 mL (0 mLs Intravenous Stopped 09/09/20 1106)    ____________________________________________   MDM / ED COURSE   34 year old male with history of Crohn's disease who has been off medications for the past 1 year, presents with evidence of a Crohn's flare as well as nondisplaced fractures to his left ankle, ultimately amenable to outpatient management.  Normal vitals on room air.  Exam with well-appearing patient without distress to have some tenderness to his left-sided abdomen that he reports has been there for decades without peritoneal features.  He otherwise has some tenderness to his left foot and ankle over his mid metatarsals and over his lateral malleolus.  Plain films confirm nondisplaced fractures to these locations, at the base of his second and third metatarsal and is distal fibula.  No surgical pathology.  Patient was placed in a walking boot and provided crutches with directions to follow-up with orthopedics for this.  Patient's GI symptoms improved with oral steroids and his tenderness resolved.  Subsequently tolerating p.o. intake without complication.  He has no increased pain from baseline, fevers or  diarrhea to suggest infectious intestinal pathology at this time.  Start the patient on a long taper of prednisone to treat a Crohn's flare with directions [facility].  We discussed return precautions for the ED.  Patient medically stable for discharge home.   Clinical Course as of Sep 09 1106  Thu Sep 09, 2020  6712 Plain films reviewed with minimally displaced fracture to the lateral malleolus as well as fracture to the base of his second and third metatarsals.  Nothing appears operative.  Will place the patient in a walking boot and await radiology reads   [DS]  0759 Reassessed.  Educated patient on plain film results and we discussed management of Crohn's disease flare with steroids.  We discussed need to assess his hemoglobin   [DS]  0933 Reassessed.  Patient reports improving symptoms.  Shared decision making on the utility of a medical observation admission for his Crohn's flare versus trial of outpatient management.  Patient reports motivation to go home because it is a holiday today and attempt  a trial of outpatient management.  We discussed p.o. challenge and we discussed return precautions for the ED.   [DS]    Clinical Course User Index [DS] Delton Prairie, MD    ____________________________________________   FINAL CLINICAL IMPRESSION(S) / ED DIAGNOSES  Final diagnoses:  Crohn's disease with rectal bleeding, unspecified gastrointestinal tract location [facility])  Closed nondisplaced fracture of lateral malleolus of left fibula, initial encounter  Nondisplaced fracture of second metatarsal bone, left foot, initial encounter for closed fracture  Closed nondisplaced fracture of third metatarsal bone of left foot, initial encounter     ED Discharge Orders         Ordered    predniSONE (STERAPRED UNI-PAK 21 TAB) 10 MG (21) TBPK tablet        09/09/20 1001           Carsin Randazzo   Note:  This document was prepared using Manufacturing engineer and may include unintentional dictation errors.   Delton Prairie, MD 09/09/20 1110

## 2020-09-09 NOTE — Discharge Instructions (Addendum)
Please take Tylenol and ibuprofen/Advil for your pain.  It is safe to take them together, or to alternate them every few hours.  Take up to 1000mg  of Tylenol at a time, up to 4 times per day.  Do not take more than 4000 mg of Tylenol in 24 hours.  For ibuprofen, take 400-600 mg, 4-5 times per day.  You are being discharged with a prescription for a prednisone taper for the next 4 weeks. As described in the prescription, please take 40 mg once daily for the first week (we gave you the first dose for today, so this first week only have 6 days).  Cut down to 3 tablets once daily for the next week, so on and so forth as the prescription details.  As we discussed, you broke your left ankle and your left foot.  These fractures will not require a surgery at this point and they should heal on their own appropriately as long as you keep your foot in the walking boot to keep everything aligned and protected.  [facility] in the next 1-2 weeks to ensure that everything is healing appropriately.  Do not walk without the boot until you follow-up with an orthopedic doctor and get cleared.  This will likely take 4+ weeks.  If you develop any significantly worsening symptoms despite the above medications, please return to the ED.

## 2020-09-09 NOTE — ED Triage Notes (Addendum)
Pt ems from home for gi bleeding that started last night approx 2300. Pt had another instance of bleeding this am with weakness, fell and twisted left ankle. Pt with crohns since 2011

## 2020-11-10 ENCOUNTER — Emergency Department: Payer: No Typology Code available for payment source

## 2020-11-10 ENCOUNTER — Inpatient Hospital Stay
Admission: EM | Admit: 2020-11-10 | Discharge: 2020-11-14 | DRG: 386 | Disposition: A | Discharge status: Home / Self Care | Payer: No Typology Code available for payment source | Attending: Internal Medicine | Admitting: Internal Medicine

## 2020-11-10 ENCOUNTER — Other Ambulatory Visit: Payer: Self-pay

## 2020-11-10 ENCOUNTER — Encounter: Payer: Self-pay | Admitting: Emergency Medicine

## 2020-11-10 DIAGNOSIS — W19XXXA Unspecified fall, initial encounter: Secondary | ICD-10-CM

## 2020-11-10 DIAGNOSIS — F4310 Post-traumatic stress disorder, unspecified: Secondary | ICD-10-CM | POA: Diagnosis not present

## 2020-11-10 DIAGNOSIS — R630 Anorexia: Secondary | ICD-10-CM | POA: Diagnosis present

## 2020-11-10 DIAGNOSIS — Z7952 Long term (current) use of systemic steroids: Secondary | ICD-10-CM

## 2020-11-10 DIAGNOSIS — K5090 Crohn's disease, unspecified, without complications: Secondary | ICD-10-CM | POA: Diagnosis present

## 2020-11-10 DIAGNOSIS — I9589 Other hypotension: Secondary | ICD-10-CM | POA: Diagnosis present

## 2020-11-10 DIAGNOSIS — K50811 Crohn's disease of both small and large intestine with rectal bleeding: Secondary | ICD-10-CM | POA: Diagnosis present

## 2020-11-10 DIAGNOSIS — G40909 Epilepsy, unspecified, not intractable, without status epilepticus: Secondary | ICD-10-CM | POA: Diagnosis present

## 2020-11-10 DIAGNOSIS — R634 Abnormal weight loss: Secondary | ICD-10-CM | POA: Diagnosis present

## 2020-11-10 DIAGNOSIS — R7989 Other specified abnormal findings of blood chemistry: Secondary | ICD-10-CM

## 2020-11-10 DIAGNOSIS — D62 Acute posthemorrhagic anemia: Secondary | ICD-10-CM | POA: Diagnosis present

## 2020-11-10 DIAGNOSIS — Z91010 Allergy to peanuts: Secondary | ICD-10-CM

## 2020-11-10 DIAGNOSIS — E876 Hypokalemia: Secondary | ICD-10-CM | POA: Diagnosis present

## 2020-11-10 DIAGNOSIS — N179 Acute kidney failure, unspecified: Secondary | ICD-10-CM | POA: Diagnosis present

## 2020-11-10 DIAGNOSIS — Z20822 Contact with and (suspected) exposure to covid-19: Secondary | ICD-10-CM | POA: Diagnosis present

## 2020-11-10 DIAGNOSIS — E8809 Other disorders of plasma-protein metabolism, not elsewhere classified: Secondary | ICD-10-CM | POA: Diagnosis present

## 2020-11-10 DIAGNOSIS — K922 Gastrointestinal hemorrhage, unspecified: Secondary | ICD-10-CM | POA: Diagnosis not present

## 2020-11-10 DIAGNOSIS — R1084 Generalized abdominal pain: Secondary | ICD-10-CM

## 2020-11-10 DIAGNOSIS — K561 Intussusception: Secondary | ICD-10-CM | POA: Diagnosis present

## 2020-11-10 DIAGNOSIS — I200 Unstable angina: Secondary | ICD-10-CM | POA: Diagnosis present

## 2020-11-10 DIAGNOSIS — F4323 Adjustment disorder with mixed anxiety and depressed mood: Secondary | ICD-10-CM | POA: Diagnosis present

## 2020-11-10 DIAGNOSIS — D649 Anemia, unspecified: Secondary | ICD-10-CM | POA: Diagnosis present

## 2020-11-10 DIAGNOSIS — R531 Weakness: Secondary | ICD-10-CM

## 2020-11-10 DIAGNOSIS — E861 Hypovolemia: Secondary | ICD-10-CM

## 2020-11-10 DIAGNOSIS — G8929 Other chronic pain: Secondary | ICD-10-CM | POA: Diagnosis present

## 2020-11-10 DIAGNOSIS — F17290 Nicotine dependence, other tobacco product, uncomplicated: Secondary | ICD-10-CM | POA: Diagnosis present

## 2020-11-10 DIAGNOSIS — I248 Other forms of acute ischemic heart disease: Secondary | ICD-10-CM | POA: Diagnosis present

## 2020-11-10 DIAGNOSIS — R197 Diarrhea, unspecified: Secondary | ICD-10-CM

## 2020-11-10 DIAGNOSIS — Z79899 Other long term (current) drug therapy: Secondary | ICD-10-CM

## 2020-11-10 DIAGNOSIS — K921 Melena: Secondary | ICD-10-CM

## 2020-11-10 DIAGNOSIS — Z888 Allergy status to other drugs, medicaments and biological substances status: Secondary | ICD-10-CM

## 2020-11-10 DIAGNOSIS — K5080 Crohn's disease of both small and large intestine without complications: Principal | ICD-10-CM | POA: Diagnosis present

## 2020-11-10 DIAGNOSIS — E860 Dehydration: Secondary | ICD-10-CM

## 2020-11-10 DIAGNOSIS — M419 Scoliosis, unspecified: Secondary | ICD-10-CM | POA: Diagnosis present

## 2020-11-10 DIAGNOSIS — R778 Other specified abnormalities of plasma proteins: Secondary | ICD-10-CM

## 2020-11-10 HISTORY — DX: Unspecified intracranial injury with loss of consciousness of unspecified duration, initial encounter: S06.9X9A

## 2020-11-10 HISTORY — DX: Unspecified intracranial injury with loss of consciousness status unknown, initial encounter: S06.9XAA

## 2020-11-10 HISTORY — DX: Unspecified convulsions: R56.9

## 2020-11-10 LAB — CK: Total CK: 60 U/L (ref 49–397)

## 2020-11-10 LAB — URINALYSIS, COMPLETE (UACMP) WITH MICROSCOPIC
Bacteria, UA: NONE SEEN
Bilirubin Urine: NEGATIVE
Glucose, UA: NEGATIVE mg/dL
Ketones, ur: NEGATIVE mg/dL
Leukocytes,Ua: NEGATIVE
Nitrite: NEGATIVE
Protein, ur: NEGATIVE mg/dL
Specific Gravity, Urine: 1.039 — ABNORMAL HIGH (ref 1.005–1.030)
pH: 5 (ref 5.0–8.0)

## 2020-11-10 LAB — CBC WITH DIFFERENTIAL/PLATELET
Abs Immature Granulocytes: 2.19 10*3/uL — ABNORMAL HIGH (ref 0.00–0.07)
Basophils Absolute: 0 10*3/uL (ref 0.0–0.1)
Basophils Relative: 0 %
Eosinophils Absolute: 0.1 10*3/uL (ref 0.0–0.5)
Eosinophils Relative: 0 %
HCT: 17.3 % — ABNORMAL LOW (ref 39.0–52.0)
Hemoglobin: 5.3 g/dL — ABNORMAL LOW (ref 13.0–17.0)
Immature Granulocytes: 8 %
Lymphocytes Relative: 13 %
Lymphs Abs: 3.9 10*3/uL (ref 0.7–4.0)
MCH: 25.7 pg — ABNORMAL LOW (ref 26.0–34.0)
MCHC: 30.6 g/dL (ref 30.0–36.0)
MCV: 84 fL (ref 80.0–100.0)
Monocytes Absolute: 1 10*3/uL (ref 0.1–1.0)
Monocytes Relative: 3 %
Neutro Abs: 22 10*3/uL — ABNORMAL HIGH (ref 1.7–7.7)
Neutrophils Relative %: 76 %
Platelets: 836 10*3/uL — ABNORMAL HIGH (ref 150–400)
RBC: 2.06 MIL/uL — ABNORMAL LOW (ref 4.22–5.81)
RDW: 16 % — ABNORMAL HIGH (ref 11.5–15.5)
WBC: 29.2 10*3/uL — ABNORMAL HIGH (ref 4.0–10.5)
nRBC: 0 % (ref 0.0–0.2)

## 2020-11-10 LAB — LACTIC ACID, PLASMA
Lactic Acid, Venous: 7.6 mmol/L (ref 0.5–1.9)
Lactic Acid, Venous: 1.6 mmol/L (ref 0.5–1.9)

## 2020-11-10 LAB — COMPREHENSIVE METABOLIC PANEL
ALT: 41 U/L (ref 0–44)
AST: 33 U/L (ref 15–41)
Albumin: 2.2 g/dL — ABNORMAL LOW (ref 3.5–5.0)
Alkaline Phosphatase: 109 U/L (ref 38–126)
Anion gap: 13 (ref 5–15)
BUN: 24 mg/dL — ABNORMAL HIGH (ref 6–20)
CO2: 16 mmol/L — ABNORMAL LOW (ref 22–32)
Calcium: 7.8 mg/dL — ABNORMAL LOW (ref 8.9–10.3)
Chloride: 106 mmol/L (ref 98–111)
Creatinine, Ser: 1.55 mg/dL — ABNORMAL HIGH (ref 0.61–1.24)
GFR, Estimated: 59 mL/min — ABNORMAL LOW (ref >60)
Glucose, Bld: 286 mg/dL — ABNORMAL HIGH (ref 70–99)
Potassium: 3.4 mmol/L — ABNORMAL LOW (ref 3.5–5.1)
Sodium: 135 mmol/L (ref 135–145)
Total Bilirubin: 0.2 mg/dL — ABNORMAL LOW (ref 0.3–1.2)
Total Protein: 4.8 g/dL — ABNORMAL LOW (ref 6.5–8.1)

## 2020-11-10 LAB — HEMOGLOBIN AND HEMATOCRIT, BLOOD
HCT: 21.6 % — ABNORMAL LOW (ref 39.0–52.0)
Hemoglobin: 6.8 g/dL — ABNORMAL LOW (ref 13.0–17.0)

## 2020-11-10 LAB — CBG MONITORING, ED: Glucose-Capillary: 260 mg/dL — ABNORMAL HIGH (ref 70–99)

## 2020-11-10 LAB — APTT: aPTT: <24 seconds — ABNORMAL LOW (ref 24–36)

## 2020-11-10 LAB — TROPONIN I (HIGH SENSITIVITY)
Troponin I (High Sensitivity): 281 ng/L (ref <18)
Troponin I (High Sensitivity): 429 ng/L (ref <18)
Troponin I (High Sensitivity): 193 ng/L (ref <18)

## 2020-11-10 LAB — TSH: TSH: 2.301 u[IU]/mL (ref 0.350–4.500)

## 2020-11-10 LAB — PROTIME-INR
INR: 1.2 (ref 0.8–1.2)
Prothrombin Time: 14.4 seconds (ref 11.4–15.2)

## 2020-11-10 LAB — SARS CORONAVIRUS 2 BY RT PCR (HOSPITAL ORDER, PERFORMED IN ~~LOC~~ HOSPITAL LAB): SARS Coronavirus 2: NEGATIVE

## 2020-11-10 LAB — PREPARE RBC (CROSSMATCH)

## 2020-11-10 LAB — ABO/RH: ABO/RH(D): A POS

## 2020-11-10 LAB — FIBRIN DERIVATIVES D-DIMER (ARMC ONLY): Fibrin derivatives D-dimer (ARMC): 785.74 ng/mL (FEU) — ABNORMAL HIGH (ref 0.00–499.00)

## 2020-11-10 MED ORDER — SODIUM CHLORIDE 0.9 % IV SOLN
2.0000 g | Freq: Three times a day (TID) | INTRAVENOUS | Status: DC
  Administered 2020-11-10 – 2020-11-13 (×8): 2 g via INTRAVENOUS
  Filled 2020-11-10 (×10): qty 2

## 2020-11-10 MED ORDER — MORPHINE SULFATE (PF) 4 MG/ML IV SOLN
4.0000 mg | Freq: Once | INTRAVENOUS | Status: AC
  Administered 2020-11-10: 4 mg via INTRAVENOUS
  Filled 2020-11-10: qty 1

## 2020-11-10 MED ORDER — METRONIDAZOLE IN NACL 5-0.79 MG/ML-% IV SOLN
500.0000 mg | Freq: Once | INTRAVENOUS | Status: AC
  Administered 2020-11-10: 500 mg via INTRAVENOUS
  Filled 2020-11-10: qty 100

## 2020-11-10 MED ORDER — SODIUM CHLORIDE 0.9% IV SOLUTION
Freq: Once | INTRAVENOUS | Status: AC
  Filled 2020-11-10: qty 250

## 2020-11-10 MED ORDER — SODIUM CHLORIDE 0.9 % IV SOLN
10.0000 mL/h | Freq: Once | INTRAVENOUS | Status: AC
  Administered 2020-11-10: 10 mL/h via INTRAVENOUS

## 2020-11-10 MED ORDER — FENTANYL CITRATE (PF) 100 MCG/2ML IJ SOLN
50.0000 ug | INTRAMUSCULAR | Status: AC | PRN
  Administered 2020-11-11 (×4): 50 ug via INTRAVENOUS
  Filled 2020-11-10 (×4): qty 2

## 2020-11-10 MED ORDER — LACTATED RINGERS IV BOLUS (SEPSIS)
500.0000 mL | Freq: Once | INTRAVENOUS | Status: AC
  Administered 2020-11-10: 500 mL via INTRAVENOUS

## 2020-11-10 MED ORDER — ACETAMINOPHEN 325 MG PO TABS: 650.0000 mg | ORAL_TABLET | Freq: Four times a day (QID) | ORAL | Status: DC | PRN

## 2020-11-10 MED ORDER — LACTATED RINGERS IV SOLN: INTRAVENOUS | Status: AC

## 2020-11-10 MED ORDER — TOPIRAMATE 15 MG PO CPSP: 60.0000 mg | ORAL_CAPSULE | Freq: Every day | ORAL | Status: DC

## 2020-11-10 MED ORDER — LACTATED RINGERS IV BOLUS (SEPSIS)
1000.0000 mL | Freq: Once | INTRAVENOUS | Status: AC
  Administered 2020-11-10: 1000 mL via INTRAVENOUS

## 2020-11-10 MED ORDER — NORTRIPTYLINE HCL 25 MG PO CAPS
50.0000 mg | ORAL_CAPSULE | Freq: Every day | ORAL | Status: DC
  Administered 2020-11-10 – 2020-11-13 (×4): 50 mg via ORAL
  Filled 2020-11-10 (×6): qty 2

## 2020-11-10 MED ORDER — ONDANSETRON HCL 4 MG PO TABS: 4.0000 mg | ORAL_TABLET | Freq: Four times a day (QID) | ORAL | Status: DC | PRN

## 2020-11-10 MED ORDER — ONDANSETRON HCL 4 MG/2ML IJ SOLN
4.0000 mg | Freq: Once | INTRAMUSCULAR | Status: AC
  Administered 2020-11-10: 4 mg via INTRAVENOUS
  Filled 2020-11-10: qty 2

## 2020-11-10 MED ORDER — QUETIAPINE FUMARATE 100 MG PO TABS
100.0000 mg | ORAL_TABLET | Freq: Every day | ORAL | Status: DC
  Administered 2020-11-10 – 2020-11-13 (×4): 100 mg via ORAL
  Filled 2020-11-10: qty 1
  Filled 2020-11-10: qty 4
  Filled 2020-11-10: qty 1
  Filled 2020-11-10: qty 4

## 2020-11-10 MED ORDER — TOPIRAMATE 25 MG PO TABS
50.0000 mg | ORAL_TABLET | Freq: Every day | ORAL | Status: DC
  Administered 2020-11-10 – 2020-11-14 (×5): 50 mg via ORAL
  Filled 2020-11-10 (×5): qty 2

## 2020-11-10 MED ORDER — LORAZEPAM 2 MG/ML IJ SOLN: 2.0000 mg | INTRAMUSCULAR | Status: DC | PRN

## 2020-11-10 MED ORDER — HYDROXYZINE PAMOATE 25 MG PO CAPS
25.0000 mg | ORAL_CAPSULE | Freq: Three times a day (TID) | ORAL | Status: DC | PRN
  Filled 2020-11-10: qty 1

## 2020-11-10 MED ORDER — ONDANSETRON HCL 4 MG/2ML IJ SOLN: 4.0000 mg | Freq: Four times a day (QID) | INTRAMUSCULAR | Status: DC | PRN

## 2020-11-10 MED ORDER — SODIUM CHLORIDE 0.9 % IV SOLN
2.0000 g | Freq: Once | INTRAVENOUS | Status: AC
  Administered 2020-11-10: 2 g via INTRAVENOUS
  Filled 2020-11-10: qty 2

## 2020-11-10 MED ORDER — FERROUS SULFATE 325 (65 FE) MG PO TABS
325.0000 mg | ORAL_TABLET | Freq: Two times a day (BID) | ORAL | Status: DC
  Administered 2020-11-11 – 2020-11-14 (×7): 325 mg via ORAL
  Filled 2020-11-10 (×8): qty 1

## 2020-11-10 MED ORDER — VENLAFAXINE HCL 25 MG PO TABS
50.0000 mg | ORAL_TABLET | Freq: Two times a day (BID) | ORAL | Status: DC
  Administered 2020-11-11 – 2020-11-14 (×7): 50 mg via ORAL
  Filled 2020-11-10 (×10): qty 2

## 2020-11-10 MED ORDER — METHYLPREDNISOLONE SODIUM SUCC 125 MG IJ SOLR
60.0000 mg | Freq: Every day | INTRAMUSCULAR | Status: DC
  Administered 2020-11-10 – 2020-11-14 (×5): 60 mg via INTRAVENOUS
  Filled 2020-11-10 (×5): qty 2

## 2020-11-10 MED ORDER — METRONIDAZOLE IN NACL 5-0.79 MG/ML-% IV SOLN
500.0000 mg | Freq: Three times a day (TID) | INTRAVENOUS | Status: DC
  Administered 2020-11-10 – 2020-11-13 (×9): 500 mg via INTRAVENOUS
  Filled 2020-11-10 (×11): qty 100

## 2020-11-10 MED ORDER — IOHEXOL 350 MG/ML SOLN
75.0000 mL | Freq: Once | INTRAVENOUS | Status: AC | PRN
  Administered 2020-11-10: 75 mL via INTRAVENOUS

## 2020-11-10 MED ORDER — MORPHINE SULFATE (PF) 2 MG/ML IV SOLN
2.0000 mg | INTRAVENOUS | Status: AC | PRN
Start: 2020-11-10 — End: 2020-11-10
  Administered 2020-11-10 (×4): 2 mg via INTRAVENOUS
  Filled 2020-11-10 (×4): qty 1

## 2020-11-10 MED ORDER — ACETAMINOPHEN 650 MG RE SUPP: 325.0000 mg | Freq: Four times a day (QID) | RECTAL | Status: DC | PRN

## 2020-11-10 MED ORDER — FOLIC ACID 1 MG PO TABS
1.0000 mg | ORAL_TABLET | Freq: Every day | ORAL | Status: DC
  Administered 2020-11-11 – 2020-11-14 (×4): 1 mg via ORAL
  Filled 2020-11-10 (×3): qty 1

## 2020-11-10 NOTE — H&P (Addendum)
History and Physical   William Vaughan IRS:[id number] DOB: 02-Aug-1986 DOA: 11/10/2020  PCP: [facility]  [facility] Patient coming [facility]   I have personally briefly reviewed patient's old medical records in [facility] EMR.  Chief Concern: chest pain and weakness  HPI: William Vaughan is a 35 y.o. male with medical history significant for Crohn's, PTSD, anxiety/depression, presented to the emergency department for chief concerns of chest pain.  He reports chest pain started around 3 AM, while having a bowel movement.  He describes the pain as pressured with burning of your lips and he felt palpitations, 8/10 and now a 6/10.  He reports the pain lasts hours. He denies numbness in his upper extremiites. He felt abnormal tastes in his mouth. He has never felt this way. He endorses BRBPR for over one year and states [facility] including for [facility]'s [facility].  He states his last PO intake was last night, 11/09/2020. He endorses weight lost in 1 month of approximately 30 lbs. he states he is trying to gain weight.  He reports that at the [place], he gets is on ustekinumab infusion two weeks ago at the [place]. this was his first treatment with ustekinumab. Previously he was on prednisone PO. He has been on humira and sulfasalazine 1000 mg PO BID. Per patient, these were unsuccessful at inducing remission for Crohn's.  Vaccinations: two shots of COVID vaccine.  Social history: He previously served as Dispensing optician.  He formerly smoked tobacco use, he hasn't smoked in 1 month. He denies etoh and recreational drug use.   ROS: Constitutional: no weight change, no fever ENT/Mouth: no sore throat, no rhinorrhea Eyes: no eye pain, no vision changes Cardiovascular: no chest pain, no dyspnea,  no edema, no palpitations Respiratory: no cough, no sputum, no wheezing Gastrointestinal: + nausea, no vomiting, + diarrhea, no constipation, PRBPR for about 1  year Genitourinary: no urinary incontinence, no dysuria, no hematuria Musculoskeletal: no arthralgias, no myalgias Skin: no skin lesions, no pruritus, Neuro: + weakness, no loss of consciousness, no syncope Psych: no anxiety, no depression, + decrease appetite Heme/Lymph: no bruising, no bleeding  ED Course: Discussed with ED provider, patient requiring hospitalization for acute GI bleed.  Assessment/Plan  Principal Problem:   GI bleed Active Problems:   Adjustment disorder with mixed anxiety and depressed mood   PTSD (post-traumatic stress disorder)   Crohn's disease (HCC)   Severe anemia   Severe acute anemia secondary to GI bleed - suspect lower GI - ED Provider has ordered 2 units of PRBC -H/H ordered for 1930 for appropriate increase - patient's last meal was evening of 11/09/20 - Continue NPO - GI has been consulted  -General surgery has been consulted  Abdominal pain - suspect severe Crohn's flare - First dose of ustekinumab was about two weeks ago via infusion, per pt, he is will get subcutaneous injection 8 weeks later -GI has been consulted appreciate further recommendation -Pain control morphine 2 mg IV every 2 hours as needed for moderate pain and fentanyl 50 mcg every 1 hour for severe pain, 4 doses each ordered  Leukocytosis - suspect reactive due to acute anemia due to GI bleed -C. difficile quick screen with PCR ordered and GI panel ordered -CBC in the a.m. -Status post cefepime and metronidazole per ED provider, we will continue  Elevated troponin-low clinical suspicion for ACS at this time suspect secondary to acute anemia -We will continue to  trend after packed red blood cell infusion - EKG shows sinus tachycardia 102, Qtc 414   AKI -suspect pre-renal in setting of acute anemia due to blood loss  - status pose IVF and 2 units of PRBC in process - BMP in the AM  Intussusception - Gen surgery has been consulted and states no acute surgical issues  -  Recommends aggressive medical management  Per patient he has possible diagnosis of seizures/epilepsy- he states he is on daily topiramate and denies recent seizures. He reports he does not know when he had a seizure last - He reports [facility] not [facility] with [facility] as the last EEG done at [place], per patient, no one was available to read the EEG - Patient does not know his topiramate dosing and states it is on his phone and his sister is on her way to bring his phone - In the mean time, I have placed patient on seizure precautions - Ativan 2 mg IV push prn for seizure activity  Chart reviewed.   TOC consulted as patient is a [facility] and nursing communication place for obtaining medical records.   Addendum: patient is status post 2 units PRBC and H/H was 6.8/21.6 - One unit PRBC has been ordered - Continue NPO  DVT prophylaxis: holding due to severe anemia Code Status: full code Diet: NPO Family Communication: no  Disposition Plan: pending clinical course and GI recommendations Consults called: GI and general surgery Admission status: Observation to stepdown  Past Medical History:  Diagnosis Date  . Crohn's disease (HCC) 12/2008  . PTSD (post-traumatic stress disorder)    History reviewed. No pertinent surgical history.  Social History:  reports that he has been smoking cigars. He has never used smokeless tobacco. He reports current alcohol use. He reports previous drug use.  Allergies  Allergen Reactions  . Diltiazem Rash   History reviewed. No pertinent family history. Family history: Family history reviewed and not pertinent  Prior to Admission medications   Medication Sig Start Date End Date Taking? Authorizing Provider  cetirizine (ZYRTEC) 10 MG tablet Take 10 mg by mouth daily.    [provider]  cyanocobalamin 1000 MCG tablet Take 1,000 mcg by mouth daily.    [provider]  dicyclomine (BENTYL) 10 MG capsule Take 10 mg by mouth every 6  (six) hours.    [provider]  dicyclomine (BENTYL) 20 MG tablet Take 1 tablet (20 mg total) by mouth 3 (three) times daily as needed (abdominal pain). 12/11/18   Phineas Semen, MD  DULoxetine (CYMBALTA) 60 MG capsule Take 60 mg by mouth 2 (two) times daily.    [provider]  eszopiclone (LUNESTA) 1 MG TABS tablet Take 3 mg by mouth at bedtime. Take immediately before bedtime    [provider]  ferrous sulfate 325 (65 FE) MG EC tablet Take 325 mg by mouth daily with breakfast.    [provider]  folic acid (FOLVITE) 1 MG tablet Take 1 mg by mouth daily.    [provider]  gabapentin (NEURONTIN) 300 MG capsule Take 300 mg by mouth 3 (three) times daily.    [provider]  hydrOXYzine (VISTARIL) 25 MG capsule Take 25 mg by mouth 3 (three) times daily as needed.    [provider]  QUEtiapine (SEROQUEL) 200 MG tablet Take 100 mg by mouth at bedtime.    [provider]  sulfaSALAzine (AZULFIDINE) 500 MG tablet Take 1,000 mg by mouth 2 (two)  times daily.    [provider]   Physical Exam: Vitals:   11/10/20 1230 11/10/20 1258 11/10/20 1319 11/10/20 1330  BP: (!) 100/56 (!) 90/44 (!) 112/59 103/63  Pulse: 94 88 86 85  Resp: 16 18 18 16   Temp:  97.8 F (36.6 C) 98.2 F (36.8 C)   TempSrc:  Oral Oral   SpO2: 100%  100% 100%  Weight:      Height:       Constitutional: appears , NAD, calm, comfortable Eyes: PERRL, lids and conjunctivae normal ENMT: Mucous membranes are moist. Posterior pharynx clear of any exudate or lesions. Age-appropriate dentition. Hearing appropriate/ Neck: normal, supple, no masses, no thyromegaly Respiratory: clear to auscultation bilaterally, no wheezing, no crackles. Normal respiratory effort. No accessory muscle use.  Cardiovascular: Regular rate and rhythm, no murmurs / rubs / gallops. No extremity edema. 2+ pedal pulses. No carotid bruits.  Abdomen: no tenderness, no masses  palpated, no hepatosplenomegaly. Bowel sounds positive.  Musculoskeletal: no clubbing / cyanosis. No joint deformity upper and lower extremities. Good ROM, no contractures, no atrophy. Normal muscle tone.  Skin: Pale, no rashes, lesions, ulcers. No induration Neurologic: Sensation intact. Strength 5/5 in all 4.  Psychiatric: Normal judgment and insight. Alert and oriented x 3. Normal mood.   EKG: independently reviewed, showing sinus tachycardia with rate of 102, QTc 414  Chest x-ray on Admission: I personally reviewed and I agree with radiologist reading as below.  CT Angio Chest PE W and/or Wo Contrast  Result Date: 11/10/2020 CLINICAL DATA:  Chest pain. EXAM: CT ANGIOGRAPHY CHEST WITH CONTRAST TECHNIQUE: Multidetector CT imaging of the chest was performed using the standard protocol during bolus administration of intravenous contrast. Multiplanar CT image reconstructions and MIPs were obtained to evaluate the vascular anatomy. CONTRAST:  90mL OMNIPAQUE IOHEXOL 350 MG/ML SOLN COMPARISON:  Chest radiograph November 10, 2020 FINDINGS: Cardiovascular: There is no demonstrable pulmonary embolus. There is no thoracic aortic aneurysm or dissection. Visualized great vessels appear unremarkable. No evident pericardial effusion or pericardial thickening. Mediastinum/Nodes: Visualized thyroid appears unremarkable. No evident thoracic adenopathy. No esophageal lesions are appreciable. Lungs/Pleura: There are scattered areas of mild atelectatic change. No edema or airspace opacity. No appreciable pleural effusions. No pneumothorax. Trachea and major bronchial structures appear patent. Upper Abdomen: Visualized upper abdominal structures appear unremarkable. Musculoskeletal: There is mild thoracic dextroscoliosis. No blastic or lytic bone lesions are evident. There are no chest wall lesions. Review of the MIP images confirms the above findings. IMPRESSION: 1. No evident pulmonary embolus. No thoracic aortic aneurysm  or dissection. 2. Scattered areas of mild atelectatic change. No edema or airspace opacity. No pleural effusions. 3.  No evident adenopathy. Electronically Signed   By: November 12, 2020 III M.D.   On: 11/10/2020 11:47   CT ABDOMEN PELVIS W CONTRAST  Result Date: 11/10/2020 CLINICAL DATA:  Diarrhea.  History of Crohn's disease EXAM: CT ABDOMEN AND PELVIS WITH CONTRAST TECHNIQUE: Multidetector CT imaging of the abdomen and pelvis was performed using the standard protocol following bolus administration of intravenous contrast. CONTRAST:  31mL OMNIPAQUE IOHEXOL 350 MG/ML SOLN COMPARISON:  None. FINDINGS: Lower chest: Included lung bases are clear.  Heart size is normal. Hepatobiliary: No focal liver abnormality is seen. No gallstones, gallbladder wall thickening, or biliary dilatation. Pancreas: Unremarkable. No pancreatic ductal dilatation or surrounding inflammatory changes. Spleen: There are a few punctate calcifications within the spleen suggesting sequela of chronic granulomatous disease. Spleen otherwise normal in size and appearance. Adrenals/Urinary Tract: Unremarkable adrenal glands.  Kidneys enhance symmetrically without focal lesion, stone, or hydronephrosis. Ureters are nondilated. Urinary bladder appears unremarkable. Stomach/Bowel: Findings of mild near pancolonic colitis with mild diffuse colonic wall thickening, loss of haustration, and subtle associated fat stranding. Findings are most evident within the proximal transverse colon and mid descending colon. There are a few filling defects within the region of the hepatic flexure and proximal transverse colon which are felt to most likely represents stool. Underlying colonic mass would be difficult to exclude. Submucosal fat deposition within the terminal ileum. Incidental note of a small bowel-small bowel intussusception in the left hemiabdomen (series 5, image 54). No definite lead point mass lesion. No bowel obstruction. Normal appendix within the  right lower quadrant (series 4, image 65). Stomach within normal limits. Vascular/Lymphatic: No significant vascular findings are present. No enlarged abdominal or pelvic lymph nodes. Reproductive: Prostate is unremarkable. Other: No free fluid. No abdominopelvic fluid collection. No pneumoperitoneum. No abdominal wall hernia. Musculoskeletal: Subtle subchondral cystic change versus tiny erosions of the bilateral SI joints, potentially reflecting sequela of sacroiliitis. No acute or significant osseous findings. IMPRESSION: 1. Findings of mild diffuse pancolitis, most evident within the proximal transverse colon and mid descending colon. Findings are in keeping with patient's reported diagnosis of Crohn's disease. There are a few filling defects within the region of the hepatic flexure and proximal transverse colon which are felt to most likely represent stool. Underlying colonic mass would be difficult to exclude. Correlation with colonoscopy is recommended following resolution of patient's acute symptoms, if not recently performed. 2. Incidental note of a small bowel-small bowel intussusception in the left hemiabdomen. No definite lead point mass lesion. No bowel obstruction. Consider short-term follow-up with CT enterography. 3. Subtle subchondral cystic change versus tiny erosions of the bilateral SI joints, potentially reflecting sequela of sacroiliitis. Electronically Signed   By: Duanne GuessNicholas  Plundo D.O.   On: 11/10/2020 12:02   DG Chest Port 1 View  Result Date: 11/10/2020 CLINICAL DATA:  Diarrhea.  Multiple falls.  Right-sided rib pain. EXAM: PORTABLE CHEST 1 VIEW COMPARISON:  Chest x-ray 07/20/2014. FINDINGS: Mediastinum and hilar structures normal. Heart size normal. Low lung volumes. No focal infiltrate. No pleural effusion or pneumothorax. Mild scoliosis thoracic spine. IMPRESSION: No acute cardiopulmonary disease. Electronically Signed   By: Maisie Fushomas  Register   On: 11/10/2020 10:25   Labs on  Admission: I have personally reviewed following labs  CBC: Recent Labs  Lab 11/10/20 0926  WBC 29.2*  NEUTROABS 22.0*  HGB 5.3*  HCT 17.3*  MCV 84.0  PLT 836*   Basic Metabolic Panel: Recent Labs  Lab 11/10/20 0926  NA 135  K 3.4*  CL 106  CO2 16*  GLUCOSE 286*  BUN 24*  CREATININE 1.55*  CALCIUM 7.8*   GFR: Estimated Creatinine Clearance: 70.8 mL/min (A) (by C-G formula based on SCr of 1.55 mg/dL (H)).  Liver Function Tests: Recent Labs  Lab 11/10/20 0926  AST 33  ALT 41  ALKPHOS 109  BILITOT 0.2*  PROT 4.8*  ALBUMIN 2.2*   Coagulation Profile: Recent Labs  Lab 11/10/20 1040  INR 1.2   Cardiac Enzymes: Recent Labs  Lab 11/10/20 0926  CKTOTAL 60   CBG: Recent Labs  Lab 11/10/20 0937  GLUCAP 260*   CRITICAL CARE Performed by: Nadyne CoombesAmy N Treonna Klee  Total critical care time: 30 minutes  Critical care time was exclusive of separately billable procedures and treating other patients.  Critical care was necessary to treat or prevent imminent or life-threatening deterioration. Acute  severe anemia in setting of GI bleed  Critical care was time spent personally by me on the following activities: development of treatment plan with patient and/or surrogate as well as nursing, discussions with consultants, evaluation of patient's response to treatment, examination of patient, obtaining history from patient or surrogate, ordering and performing treatments and interventions, ordering and review of laboratory studies, ordering and review of radiographic studies, pulse oximetry and re-evaluation of patient's condition.  Venancio Chenier N Cicero Noy D.O. [facility]  If 7PM-7AM, please contact overnight-coverage provider If 7AM-7PM, please contact day coverage provider [email or web address]  11/10/2020, 1:44 PM

## 2020-11-10 NOTE — Progress Notes (Signed)
CODE SEPSIS - PHARMACY COMMUNICATION  **Broad Spectrum Antibiotics should be administered within 1 hour of Sepsis diagnosis**  Time Code Sepsis Called/Page Received: 1020  Antibiotics Ordered: cefepime/metronidazole  Time of 1st antibiotic administration: 1100    Pricilla Riffle ,PharmD Clinical Pharmacist  11/10/2020  11:38 AM

## 2020-11-10 NOTE — ED Triage Notes (Signed)
Pt to ED via EMS from home with c/o diarrhea since Thanksgiving, per EMS pt just finished round of Augmentin for diarrhea. Per EMS pt with multiple falls and increasing weakness x 24 hours, now c/o R sided rib pain. Per EMS pt laid on floor all night.   Upon arrival to ED pt noted to be pale.    94/56 CBG 436 ETCO2 18  20G to L bicep

## 2020-11-10 NOTE — ED Notes (Signed)
Updated hospitalist on Hemoglobin , she advised she would order another unit . ( MD. [facility] )

## 2020-11-10 NOTE — ED Provider Notes (Signed)
[facility] Emergency Department Provider Note   ____________________________________________   Event Date/Time   First MD Initiated Contact with Patient 11/10/20 1007     (approximate)  I have reviewed the triage vital signs and the nursing notes.   HISTORY  Chief Complaint Diarrhea and Weakness   HPI William Vaughan is a 35 y.o. male who comes in with a report of diarrhea since Thanksgiving and multiple abdominal issues.  Just finished a round of Augmentin for diarrhea.  In the last 24 hours patients become very weak and fallen multiple times.  He now has right-sided rib pain and burning in his chest.  He was on the floor all night long per EMS.  Patient's initial blood pressure was 97/40 and now it has gone down to 78 in spite of already having had a liter of saline.         Past Medical History:  Diagnosis Date  . Crohn's disease (HCC) 12/2008  . PTSD (post-traumatic stress disorder)     Patient Active Problem List   Diagnosis Date Noted  . GI bleed 11/10/2020  . Cannabis use disorder, moderate, dependence (HCC) 08/24/2016  . Suicidal ideation 08/23/2016  . Chronic pain 08/23/2016  . Adjustment disorder with mixed anxiety and depressed mood 06/20/2016  . PTSD (post-traumatic stress disorder) 06/20/2016  . Involuntary commitment 06/20/2016  . Toothache 06/20/2016  . Crohn's disease (HCC) 12/14/2008    History reviewed. No pertinent surgical history.  Prior to Admission medications   Medication Sig Start Date End Date Taking? Authorizing Provider  cetirizine (ZYRTEC) 10 MG tablet Take 10 mg by mouth daily.    [provider]  cyanocobalamin 1000 MCG tablet Take 1,000 mcg by mouth daily.    [provider]  dicyclomine (BENTYL) 10 MG capsule Take 10 mg by mouth every 6 (six) hours.    [provider]  dicyclomine (BENTYL) 20 MG tablet Take 1 tablet (20 mg total) by mouth 3 (three) times daily as needed (abdominal  pain). 12/11/18   Phineas Semen, MD  DULoxetine (CYMBALTA) 60 MG capsule Take 60 mg by mouth 2 (two) times daily.    [provider]  eszopiclone (LUNESTA) 1 MG TABS tablet Take 3 mg by mouth at bedtime. Take immediately before bedtime    [provider]  ferrous sulfate 325 (65 FE) MG EC tablet Take 325 mg by mouth daily with breakfast.    [provider]  folic acid (FOLVITE) 1 MG tablet Take 1 mg by mouth daily.    [provider]  gabapentin (NEURONTIN) 300 MG capsule Take 300 mg by mouth 3 (three) times daily.    [provider]  hydrOXYzine (VISTARIL) 25 MG capsule Take 25 mg by mouth 3 (three) times daily as needed.    [provider]  QUEtiapine (SEROQUEL) 200 MG tablet Take 100 mg by mouth at bedtime.    [provider]  sulfaSALAzine (AZULFIDINE) 500 MG tablet Take 1,000 mg by mouth 2 (two) times daily.    [provider]    Allergies Diltiazem  History reviewed. No pertinent family history.  Social History Social History   Tobacco Use  . Smoking status: Current Every Day Smoker    Types: Cigars  . Smokeless tobacco: Never Used  Substance Use Topics  . Alcohol use: Yes  . Drug use: Not Currently    Review of Systems  Constitutional: No fever/chills Eyes: No visual changes. ENT: No sore throat. Cardiovascular: Denies  chest pain. Respiratory: Denies shortness of breath. Gastrointestinal: Severe abdominal pain.  No nausea, no vomiting.   diarrhea.  No constipation. Genitourinary: Negative for dysuria. Musculoskeletal: Negative for back pain. Skin: Negative for rash. Neurological: Negative for headaches, focal weakness   ____________________________________________   PHYSICAL EXAM:  VITAL SIGNS: ED Triage Vitals  Enc Vitals Group     BP 11/10/20 0924 (!) 97/40     Pulse Rate 11/10/20 0924 (!) 105     Resp 11/10/20 0924 20     Temp 11/10/20 0924 98 F (36.7 C)     Temp Source 11/10/20  0924 Oral     SpO2 11/10/20 0924 97 %     Weight 11/10/20 0918 199 lb 1.2 oz (90.3 kg)     Height 11/10/20 0918 5\' 11"  (1.803 m)     Head Circumference --      Peak Flow --      Pain Score 11/10/20 0918 9     Pain Loc --      Pain Edu? --      Excl. in GC? --     Constitutional: Alert and oriented.  Very pale and ill-appearing Eyes: Conjunctivae are normal. PERRL. EOMI. Head: Atraumatic. Nose: No congestion/rhinnorhea. Mouth/Throat: Mucous membranes are moist.  Oropharynx non-erythematous. Neck: No stridor.   Cardiovascular: Rapid rate, regular rhythm. Grossly normal heart sounds.  Good peripheral circulation. Respiratory: Normal respiratory effort.  No retractions. Lungs CTAB. Gastrointestinal: Very firm and diffusely very tender. No distention. No abdominal bruits.  Musculoskeletal: No lower extremity tenderness nor edema.   Neurologic:  Normal speech and language. No gross focal neurologic deficits are appreciated. Skin:  Skin is warm, dry and intact. No rash noted.   ____________________________________________   LABS (all labs ordered are listed, but only abnormal results are displayed)  Labs Reviewed  CBC WITH DIFFERENTIAL/PLATELET - Abnormal; Notable for the following components:      Result Value   WBC 29.2 (*)    RBC 2.06 (*)    Hemoglobin 5.3 (*)    HCT 17.3 (*)    MCH 25.7 (*)    RDW 16.0 (*)    Platelets 836 (*)    Neutro Abs 22.0 (*)    Abs Immature Granulocytes 2.19 (*)    All other components within normal limits  COMPREHENSIVE METABOLIC PANEL - Abnormal; Notable for the following components:   Potassium 3.4 (*)    CO2 16 (*)    Glucose, Bld 286 (*)    BUN 24 (*)    Creatinine, Ser 1.55 (*)    Calcium 7.8 (*)    Total Protein 4.8 (*)    Albumin 2.2 (*)    Total Bilirubin 0.2 (*)    GFR, Estimated 59 (*)    All other components within normal limits  LACTIC ACID, PLASMA - Abnormal; Notable for the following components:   Lactic Acid, Venous 7.6  (*)    All other components within normal limits  FIBRIN DERIVATIVES D-DIMER ([facility] ONLY) - Abnormal; Notable for the following components:   Fibrin derivatives D-dimer ([facility]) 785.74 (*)    All other components within normal limits  APTT - Abnormal; Notable for the following components:   aPTT <24 (*)    All other components within normal limits  CBG MONITORING, ED - Abnormal; Notable for the following components:   Glucose-Capillary 260 (*)    All other components within normal limits  TROPONIN I (HIGH SENSITIVITY) - Abnormal; Notable for the following components:  Troponin I (High Sensitivity) 281 (*)    All other components within normal limits  GASTROINTESTINAL PANEL BY PCR, STOOL (REPLACES STOOL CULTURE)  C DIFFICILE QUICK SCREEN W PCR REFLEX  SARS CORONAVIRUS 2 BY RT PCR (HOSPITAL ORDER, PERFORMED IN [facility])  CULTURE, BLOOD (ROUTINE X 2)  CULTURE, BLOOD (ROUTINE X 2)  URINE CULTURE  CK  PROTIME-INR  LACTIC ACID, PLASMA  URINALYSIS, COMPLETE (UACMP) WITH MICROSCOPIC  TYPE AND SCREEN  PREPARE RBC (CROSSMATCH)  ABO/RH  TROPONIN I (HIGH SENSITIVITY)   ____________________________________________  EKG EKG read interpreted by me shows sinus tachycardia rate 102 normal axis nonspecific ST-T wave changes  ____________________________________________  RADIOLOGY Jill Poling, personally viewed and evaluated these images (plain radiographs) as part of my medical decision making, as well as reviewing the written report by the radiologist.  ED MD interpretation: Chest x-ray reviewed very quickly by me does not show any obvious pathology.  I am going to wait for the radiologist report to make sure I did not miss any rib fractures CT of the chest does not show any rib fractures either per radiology.  CT of the abdomen shows pancolitis and a small intussusception of small bowel. Official radiology report(s): CT Angio Chest PE W and/or Wo Contrast  Result  Date: 11/10/2020 CLINICAL DATA:  Chest pain. EXAM: CT ANGIOGRAPHY CHEST WITH CONTRAST TECHNIQUE: Multidetector CT imaging of the chest was performed using the standard protocol during bolus administration of intravenous contrast. Multiplanar CT image reconstructions and MIPs were obtained to evaluate the vascular anatomy. CONTRAST:  41mL OMNIPAQUE IOHEXOL 350 MG/ML SOLN COMPARISON:  Chest radiograph November 10, 2020 FINDINGS: Cardiovascular: There is no demonstrable pulmonary embolus. There is no thoracic aortic aneurysm or dissection. Visualized great vessels appear unremarkable. No evident pericardial effusion or pericardial thickening. Mediastinum/Nodes: Visualized thyroid appears unremarkable. No evident thoracic adenopathy. No esophageal lesions are appreciable. Lungs/Pleura: There are scattered areas of mild atelectatic change. No edema or airspace opacity. No appreciable pleural effusions. No pneumothorax. Trachea and major bronchial structures appear patent. Upper Abdomen: Visualized upper abdominal structures appear unremarkable. Musculoskeletal: There is mild thoracic dextroscoliosis. No blastic or lytic bone lesions are evident. There are no chest wall lesions. Review of the MIP images confirms the above findings. IMPRESSION: 1. No evident pulmonary embolus. No thoracic aortic aneurysm or dissection. 2. Scattered areas of mild atelectatic change. No edema or airspace opacity. No pleural effusions. 3.  No evident adenopathy. Electronically Signed   By: Bretta Bang III M.D.   On: 11/10/2020 11:47   CT ABDOMEN PELVIS W CONTRAST  Result Date: 11/10/2020 CLINICAL DATA:  Diarrhea.  History of Crohn's disease EXAM: CT ABDOMEN AND PELVIS WITH CONTRAST TECHNIQUE: Multidetector CT imaging of the abdomen and pelvis was performed using the standard protocol following bolus administration of intravenous contrast. CONTRAST:  62mL OMNIPAQUE IOHEXOL 350 MG/ML SOLN COMPARISON:  None. FINDINGS: Lower chest:  Included lung bases are clear.  Heart size is normal. Hepatobiliary: No focal liver abnormality is seen. No gallstones, gallbladder wall thickening, or biliary dilatation. Pancreas: Unremarkable. No pancreatic ductal dilatation or surrounding inflammatory changes. Spleen: There are a few punctate calcifications within the spleen suggesting sequela of chronic granulomatous disease. Spleen otherwise normal in size and appearance. Adrenals/Urinary Tract: Unremarkable adrenal glands. Kidneys enhance symmetrically without focal lesion, stone, or hydronephrosis. Ureters are nondilated. Urinary bladder appears unremarkable. Stomach/Bowel: Findings of mild near pancolonic colitis with mild diffuse colonic wall thickening, loss of haustration, and subtle associated fat stranding.  Findings are most evident within the proximal transverse colon and mid descending colon. There are a few filling defects within the region of the hepatic flexure and proximal transverse colon which are felt to most likely represents stool. Underlying colonic mass would be difficult to exclude. Submucosal fat deposition within the terminal ileum. Incidental note of a small bowel-small bowel intussusception in the left hemiabdomen (series 5, image 54). No definite lead point mass lesion. No bowel obstruction. Normal appendix within the right lower quadrant (series 4, image 65). Stomach within normal limits. Vascular/Lymphatic: No significant vascular findings are present. No enlarged abdominal or pelvic lymph nodes. Reproductive: Prostate is unremarkable. Other: No free fluid. No abdominopelvic fluid collection. No pneumoperitoneum. No abdominal wall hernia. Musculoskeletal: Subtle subchondral cystic change versus tiny erosions of the bilateral SI joints, potentially reflecting sequela of sacroiliitis. No acute or significant osseous findings. IMPRESSION: 1. Findings of mild diffuse pancolitis, most evident within the proximal transverse colon and mid  descending colon. Findings are in keeping with patient's reported diagnosis of Crohn's disease. There are a few filling defects within the region of the hepatic flexure and proximal transverse colon which are felt to most likely represent stool. Underlying colonic mass would be difficult to exclude. Correlation with colonoscopy is recommended following resolution of patient's acute symptoms, if not recently performed. 2. Incidental note of a small bowel-small bowel intussusception in the left hemiabdomen. No definite lead point mass lesion. No bowel obstruction. Consider short-term follow-up with CT enterography. 3. Subtle subchondral cystic change versus tiny erosions of the bilateral SI joints, potentially reflecting sequela of sacroiliitis. Electronically Signed   By: Duanne GuessNicholas  Plundo D.O.   On: 11/10/2020 12:02   DG Chest Port 1 View  Result Date: 11/10/2020 CLINICAL DATA:  Diarrhea.  Multiple falls.  Right-sided rib pain. EXAM: PORTABLE CHEST 1 VIEW COMPARISON:  Chest x-ray 07/20/2014. FINDINGS: Mediastinum and hilar structures normal. Heart size normal. Low lung volumes. No focal infiltrate. No pleural effusion or pneumothorax. Mild scoliosis thoracic spine. IMPRESSION: No acute cardiopulmonary disease. Electronically Signed   By: Maisie Fushomas  Register   On: 11/10/2020 10:25    ____________________________________________   PROCEDURES  Procedure(s) performed (including Critical Care): Critical care time half an hour this includes evaluating the patient reviewing his old records consulting with the hospitalist and the surgeon and the gastroenterologist.  Procedures   ____________________________________________   INITIAL IMPRESSION / ASSESSMENT AND PLAN / ED COURSE  Patient with history of Crohn's disease now with severe abdominal pain hypotension diarrhea.  His white count is very high his lactic acid is very high we will have to get him in the hospital.  I do want to CT his abdomen first and  the way his chest is hurting him a CT that as well.  Does not sound cardiac but as inflammatory bowel disease is a risk factor I will check a troponin.    ----------------------------------------- 12:48 PM on 11/10/2020 -----------------------------------------  Patient's troponin is elevated but he is having a GI bleed we cannot give him heparin or other blood thinners at this point.  I have discussed him with Dr. Norma Fredricksonoledo and GI and Dr. Tonna BoehringerSakai on surgery.  Additionally Dr. Sedalia Mutaox is down seeing him now.  We will continue to evaluate him.  He is consented to transfusion and I have ordered that that will be ongoing soon.  He is getting more fluids and antibiotics.  His stool specimens have been ordered for culture and C. difficile.  This will be important in differentiating  whether this pancolitis is due to his Crohn's for an infection.  Fact that his white blood count is so high leads me to worry that he may have an infection.         ____________________________________________   FINAL CLINICAL IMPRESSION(S) / ED DIAGNOSES  Final diagnoses:  Weakness  Dehydration  Hematochezia  Elevated troponin  Hypotension due to hypovolemia  Diarrhea, unspecified type  Generalized abdominal pain     ED Discharge Orders    None      *Please note:  William Vaughan was evaluated in Emergency Department on 11/10/2020 for the symptoms described in the history of present illness. He was evaluated in the context of the global COVID-19 pandemic, which necessitated consideration that the patient might be at risk for infection with the SARS-CoV-2 virus that causes COVID-19. Institutional protocols and algorithms that pertain to the evaluation of patients at risk for COVID-19 are in a state of rapid change based on information released by regulatory bodies including the CDC and federal and state organizations. These policies and algorithms were followed during the patient's care in the ED.  Some ED evaluations and  interventions may be delayed as a result of limited staffing during and the pandemic.*   Note:  This document was prepared using Dragon voice recognition software and may include unintentional dictation errors.    Arnaldo Natal, MD 11/10/20 1249

## 2020-11-10 NOTE — ED Notes (Signed)
Date and time results received: 11/10/20 11:42 AM  Test: Troponin Critical Value: 281  Name of Provider Notified: Malinda  Orders Received? Or Actions Taken?: Dr Darnelle Catalan to follow up

## 2020-11-10 NOTE — Progress Notes (Signed)
Pharmacy Antibiotic Note  William Vaughan is a 35 y.o. male admitted on 11/10/2020. Pharmacy has been consulted for cefepime dosing.  Plan: Cefepime 2 g IV q8h  Height: 5\' 11"  (180.3 cm) Weight: 78.5 kg (173 lb) IBW/kg (Calculated) : 75.3  Temp (24hrs), Avg:98.1 F (36.7 C), Min:97.8 F (36.6 C), Max:98.6 F (37 C)  Recent Labs  Lab 11/10/20 0926 11/10/20 1320  WBC 29.2*  --   CREATININE 1.55*  --   LATICACIDVEN 7.6* 1.6    Estimated Creatinine Clearance: 70.8 mL/min (A) (by C-G formula based on SCr of 1.55 mg/dL (H)).    Allergies  Allergen Reactions  . Ambien [Zolpidem Tartrate]     Violent flashbacks  . Food [Peanut-Containing Drug Products] Other (See Comments)    Peanuts Makes crohn's worse/act up  . Diltiazem Rash    Antimicrobials this admission: Cefepime 1/26 >> Metronidazole 1/26 >>   Microbiology results: 1/26 BCx: pending   Thank you for allowing pharmacy to be a part of this patient's care.  2/26, PharmD 11/10/2020 4:19 PM

## 2020-11-10 NOTE — ED Notes (Signed)
Patient advised stomach chest and headache. Dr. Sedalia Muta messaged for PRN pain medications.

## 2020-11-10 NOTE — ED Notes (Signed)
Requested lab to draw blood unable to get blood attempted 2 times for trop

## 2020-11-10 NOTE — Sepsis Progress Note (Signed)
Sepsis protocol being followed by [facility] 

## 2020-11-10 NOTE — Sepsis Progress Note (Signed)
Reminded bedside nurse of need to draw and administer repeat lactic acid  and additional fluid rescusitation ordered- when she gets a chance.

## 2020-11-10 NOTE — Consult Note (Signed)
Subjective:   CC: crohn's disease  HPI:  William Vaughan is a 35 y.o. male who was consulted by [facility] for issue above.  Symptoms were first noted several months ago. Diarrhea, melena and stomach pain. Known Crohn's disease on steroids.  Gradual worsening of pain, frequency, amount of diarrhea, melena.  Pain is sharp, all four quadrants.  Associated with weakness, exacerbated by nothing specific.     Past Medical History:  has a past medical history of Crohn's disease (HCC) (12/2008) and PTSD (post-traumatic stress disorder).  Past Surgical History: History reviewed. No pertinent surgical history.  Family History: family history is not on file.  Social History:  reports that he has been smoking cigars. He has never used smokeless tobacco. He reports current alcohol use. He reports previous drug use.  Current Medications:  Prior to Admission medications   Medication Sig Start Date End Date Taking? Authorizing Provider  cetirizine (ZYRTEC) 10 MG tablet Take 10 mg by mouth daily.    [provider]  cyanocobalamin 1000 MCG tablet Take 1,000 mcg by mouth daily.    [provider]  dicyclomine (BENTYL) 10 MG capsule Take 10 mg by mouth every 6 (six) hours.    [provider]  dicyclomine (BENTYL) 20 MG tablet Take 1 tablet (20 mg total) by mouth 3 (three) times daily as needed (abdominal pain). 12/11/18   Phineas Semen, MD  DULoxetine (CYMBALTA) 60 MG capsule Take 60 mg by mouth 2 (two) times daily.    [provider]  eszopiclone (LUNESTA) 1 MG TABS tablet Take 3 mg by mouth at bedtime. Take immediately before bedtime    [provider]  ferrous sulfate 325 (65 FE) MG EC tablet Take 325 mg by mouth daily with breakfast.    [provider]  folic acid (FOLVITE) 1 MG tablet Take 1 mg by mouth daily.    [provider]  gabapentin (NEURONTIN) 300 MG capsule Take 300 mg by mouth 3 (three) times daily.    [provider]   hydrOXYzine (VISTARIL) 25 MG capsule Take 25 mg by mouth 3 (three) times daily as needed.    [provider]  QUEtiapine (SEROQUEL) 200 MG tablet Take 100 mg by mouth at bedtime.    [provider]  sulfaSALAzine (AZULFIDINE) 500 MG tablet Take 1,000 mg by mouth 2 (two) times daily.    [provider]    Allergies:  Allergies as of 11/10/2020 - Review Complete 11/10/2020  Allergen Reaction Noted  . Diltiazem Rash 09/09/2020    ROS:  General: Denies weight loss, weight gain, fatigue, fevers, chills, and night sweats. Eyes: Denies blurry vision, double vision, eye pain, itchy eyes, and tearing. Ears: Denies hearing loss, earache, and ringing in ears. Nose: Denies sinus pain, congestion, infections, runny nose, and nosebleeds. Mouth/throat: Denies hoarseness, sore throat, bleeding gums, and difficulty swallowing. Heart: Denies chest pain, palpitations, racing heart, irregular heartbeat, leg pain or swelling, and decreased activity tolerance. Respiratory: Denies breathing difficulty, shortness of breath, wheezing, cough, and sputum. GI: Denies change in appetite, heartburn, nausea, vomiting, constipation GU: Denies difficulty urinating, pain with urinating, urgency, frequency, blood in urine. Musculoskeletal: Denies joint stiffness, pain, swelling, muscle weakness. Skin: Denies rash, itching, mass, tumors, sores, and boils Neurologic: Denies headache, fainting, dizziness, seizures, numbness, and tingling. Psychiatric: Denies depression, anxiety, difficulty sleeping, and memory loss. Endocrine: Denies heat or cold intolerance, and increased thirst or urination. Blood/lymph: Denies easy bruising, easy bruising, and swollen glands  Objective:     BP (!) 90/44   Pulse 88   Temp 97.8 F (36.6 C) (Oral)   Resp 18   Ht 5\' 11"  (1.803 m)   Wt 78.5 kg   SpO2 100%   BMI 24.13 kg/m   Constitutional :  alert, cooperative, appears stated age and no distress   Lymphatics/Throat:  no asymmetry, masses, or scars  Respiratory:  clear to auscultation bilaterally  Cardiovascular:  regular rate and rhythm  Gastrointestinal: soft, TTP in all four quadrants, with some mild guarding.   Musculoskeletal: Steady movement  Skin: Cool and moist  Psychiatric: Normal affect, non-agitated, not confused       LABS:  CMP Latest Ref Rng & Units 11/10/2020 09/09/2020 12/11/2018  Glucose 70 - 99 mg/dL 12/13/2018) 474(Q) 595(G)  BUN 6 - 20 mg/dL 387(F) 20 11  Creatinine 0.61 - 1.24 mg/dL 64(P) 3.29(J) 1.88(C  Sodium 135 - 145 mmol/L 135 138 139  Potassium 3.5 - 5.1 mmol/L 3.4(L) 3.4(L) 3.8  Chloride 98 - 111 mmol/L 106 109 104  CO2 22 - 32 mmol/L 16(L) 17(L) 24  Calcium 8.9 - 10.3 mg/dL 7.8(L) 8.7(L) 9.4  Total Protein 6.5 - 8.1 g/dL 4.8(L) 6.8 8.0  Total Bilirubin 0.3 - 1.2 mg/dL 1.66) 0.4 0.6  Alkaline Phos 38 - 126 U/L 109 131(H) 128(H)  AST 15 - 41 U/L 33 31 30  ALT 0 - 44 U/L 41 30 42   CBC Latest Ref Rng & Units 11/10/2020 09/09/2020 12/11/2018  WBC 4.0 - 10.5 K/uL 29.2(H) 22.0(H) 10.1  Hemoglobin 13.0 - 17.0 g/dL 5.3(L) 12.2(L) 15.3  Hematocrit 39.0 - 52.0 % 17.3(L) 38.2(L) 46.7  Platelets 150 - 400 K/uL 836(H) 520(H) 392    RADS: CLINICAL DATA:  Diarrhea.  History of Crohn's disease  EXAM: CT ABDOMEN AND PELVIS WITH CONTRAST  TECHNIQUE: Multidetector CT imaging of the abdomen and pelvis was performed using the standard protocol following bolus administration of intravenous contrast.  CONTRAST:  37mL OMNIPAQUE IOHEXOL 350 MG/ML SOLN  COMPARISON:  None.  FINDINGS: Lower chest: Included lung bases are clear.  Heart size is normal.  Hepatobiliary: No focal liver abnormality is seen. No gallstones, gallbladder wall thickening, or biliary dilatation.  Pancreas: Unremarkable. No pancreatic ductal dilatation or surrounding inflammatory changes.  Spleen: There are a few punctate calcifications within the spleen suggesting sequela of  chronic granulomatous disease. Spleen otherwise normal in size and appearance.  Adrenals/Urinary Tract: Unremarkable adrenal glands. Kidneys enhance symmetrically without focal lesion, stone, or hydronephrosis. Ureters are nondilated. Urinary bladder appears unremarkable.  Stomach/Bowel: Findings of mild near pancolonic colitis with mild diffuse colonic wall thickening, loss of haustration, and subtle associated fat stranding. Findings are most evident within the proximal transverse colon and mid descending colon. There are a few filling defects within the region of the hepatic flexure and proximal transverse colon which are felt to most likely represents stool. Underlying colonic mass would be difficult to exclude. Submucosal fat deposition within the terminal ileum. Incidental note of a small bowel-small bowel intussusception in the left hemiabdomen (series 5, image 54). No definite lead point mass lesion. No bowel obstruction. Normal appendix within the right lower quadrant (series 4, image 65). Stomach within normal limits.  Vascular/Lymphatic: No significant vascular findings are present. No enlarged abdominal or pelvic lymph nodes.  Reproductive: Prostate is unremarkable.  Other: No free fluid. No abdominopelvic fluid collection. No pneumoperitoneum. No abdominal wall hernia.  Musculoskeletal: Subtle subchondral cystic change versus tiny erosions of the bilateral  SI joints, potentially reflecting sequela of sacroiliitis. No acute or significant osseous findings.  IMPRESSION: 1. Findings of mild diffuse pancolitis, most evident within the proximal transverse colon and mid descending colon. Findings are in keeping with patient's reported diagnosis of Crohn's disease. There are a few filling defects within the region of the hepatic flexure and proximal transverse colon which are felt to most likely represent stool. Underlying colonic mass would be difficult  to exclude. Correlation with colonoscopy is recommended following resolution of patient's acute symptoms, if not recently performed. 2. Incidental note of a small bowel-small bowel intussusception in the left hemiabdomen. No definite lead point mass lesion. No bowel obstruction. Consider short-term follow-up with CT enterography. 3. Subtle subchondral cystic change versus tiny erosions of the bilateral SI joints, potentially reflecting sequela of sacroiliitis.   Electronically Signed   By: Duanne Guess D.O.   On: 11/10/2020 12:02 Assessment:   Crohn's flare Anemia Elevated troponins Elevated Cr intussusception of small bowel   Plan:   CT images personally reviewed by myself and agree with report.  Pt has Severe Crohn's flare and resulting sequale of findings noted above.  Recommend aggressive medical management at this point, since there is no acute surgical issue such as stricture, bowel perforation, obstruction.  intussusception likely an incidental finding that is not contributing to overall clincal deterioration.  Surgery to sign off for now.  Please call with any new questions or concerns.

## 2020-11-10 NOTE — Consult Note (Signed)
GI Inpatient Consult Note  Reason for Consult: Crohn's colitis flare   Attending Requesting Consult: Dr. Conni Slipper  History of Present Illness: William Vaughan is a 35 y.o. male seen for evaluation of Crohn's colitis flare at the request of ED physician - Dr. Conni Slipper. Pt has a PMH of PTSD, marijuana and tobacco abuse, chronic pain, and Crohn's disease. He presented to the [facility] ED this morning around 0900 via EMS with chief complaint of chest pain, severe diarrhea, acute on chronic abdominal pain, and progressive weakness since Thanksgiving. Per EMS, patient reported he had been laying on the floor all night long after he became weak and fell multiple times unable to get up. Upon presentation to the ED, labs were significant for leukocytosis (29.2K), profound anemia (hemoglobin 5.3, MCV 84)), hypokalemia (3.4), and AKI (BUN 24, creatinine 1.55). Lactic acid 7.6. Troponin I elevated 281. EKG showed sinus tachycardia HR 102 with no signs of ischemia. He met sepsis protocol and was started on cefepime and metronidazole. Contrasted CT scan showed mild diffuse pancolitis most evident within the proximal transverse colon and mid descending colon and incidental note of small bowel - small bowel intussusception in left hemiabdomen. Dr. Lysle Pearl of General Surgery consulted and saw patient and signed off as there was no obvious complication (small bowel obstruction, stricture, bowel perforation). Patient was started on sepsis protocol and given IV fluid hydration, antibiotics, and transfused 2 units pRBCs.  Patient seen and examined bedside this afternoon. He reports he is very uncomfortable and reports abdominal pain is 9/10 in severity and hurts all over his abdomen, but worse in LLQ and suprapubic regions. Morphine has finally started to kick in and reports pain is now 5/10 in severity. He denies any fevers, chills, or night sweats. He reports within the last 24 hours he estimates he has had 15 loose and urgent  BMs with hematochezia. He has seen blood on tissue paper and in the toilet bowl. He reports he lives in a state of chronic pain, but over past 2 days his abdominal pain has been much worse. He just finished course of Augmentin for diarrhea. He reports his Crohn's disease is managed by a gastroenterologist at the [facility]. He was recently started on Stelara and received the induction dose just over two weeks ago. Prior to [facility], he had been on prednisone 40 mg daily daily. Prior to prednisone, he wasn't on any medication for over one year. He was previously on Humira for about 3 years, but stopped this on his own. He reports he was diagnosed with Crohn's disease back in 2009 and was initially started on 6-MP and mesalamine, but these did not induce remission. He was seen in ED 09/09/2020 by Dr. Vladimir Crofts for complaints of acute on chronic abdominal pain, diarrhea, and hematochezia where he was discharged with prednisone taper. He hasn't been the best at follow-up with the [place]. No records are available. He is unsure when exactly his last colonoscopy was. He reports he does smoke tobacco, but hasn't done this in over one month. He denies any EtOH or recreational drug use. He self reports a 30-lb weight loss over the past month.    Past Medical History:  Past Medical History:  Diagnosis Date  . Crohn's disease ([place]) 12/2008  . PTSD (post-traumatic stress disorder)     Problem List: Patient Active Problem List   Diagnosis Date Noted  . GI bleed 11/10/2020  . Severe anemia 11/10/2020  . Cannabis use disorder, moderate,  dependence ([place]) 08/24/2016  . Suicidal ideation 08/23/2016  . Chronic pain 08/23/2016  . Adjustment disorder with mixed anxiety and depressed mood 06/20/2016  . PTSD (post-traumatic stress disorder) 06/20/2016  . Involuntary commitment 06/20/2016  . Toothache 06/20/2016  . Crohn's disease ([place]) 12/14/2008    Past Surgical History: History reviewed. No pertinent surgical history.   Allergies: Allergies  Allergen Reactions  . Ambien [Zolpidem Tartrate]     Violent flashbacks  . Food [Peanut-Containing Drug Products] Other (See Comments)    Peanuts Makes crohn's worse/act up  . Diltiazem Rash    Home Medications: (Not in a hospital admission)  Home medication reconciliation was completed with the patient.   Scheduled Inpatient Medications:   . [START ON 11/11/2020] ferrous sulfate  325 mg Oral BID WC  . [START ON 2/70/3500] folic acid  1 mg Oral Daily  . QUEtiapine  100 mg Oral QHS    Continuous Inpatient Infusions:   . lactated ringers 150 mL/hr at 11/10/20 1107  . metronidazole 500 mg (11/10/20 1515)    PRN Inpatient Medications:  acetaminophen **OR** acetaminophen, fentaNYL (SUBLIMAZE) injection, hydrOXYzine, morphine injection, ondansetron **OR** ondansetron (ZOFRAN) IV  Family History: family history is not on file.  The patient's family history is negative for inflammatory bowel disorders, GI malignancy, or solid organ transplantation.  Social History:   reports that he has been smoking cigars. He has never used smokeless tobacco. He reports current alcohol use. He reports previous drug use. The patient denies ETOH, tobacco, or drug use.   Review of Systems: Constitutional: ++weight loss Eyes: No changes in vision. ENT: No oral lesions, sore throat.  GI: see HPI.  Heme/Lymph: No easy bruising.  CV: +chest pain.  GU: No hematuria.  Integumentary: No rashes.  Neuro: No headaches.  Psych: No depression/anxiety.  Endocrine: No heat/cold intolerance.  Allergic/Immunologic: No urticaria.  Resp: No cough, SOB.  Musculoskeletal: No joint swelling.    Physical Examination: BP 129/70   Pulse 98   Temp 98 F (36.7 C) (Oral)   Resp 16   Ht _0  (1.803 m)   Wt 78.5 kg   SpO2 100%   BMI 24.13 kg/m   Ill-appearing male in ED stretcher. No accessory muscle use. Answers all questions appropriately.  Gen: NAD, alert and oriented x 4 HEENT:  PEERLA, EOMI, Neck: supple, no JVD or thyromegaly Chest: CTA bilaterally, no wheezes, crackles, or other adventitious sounds CV: RRR, no m/g/c/r Abd: soft, ND, +BS in all four quadrants; diffusely tender to palpation in all four quadrants, worse in LLQ, no HSM, mild guarding without rebound tenderness Ext: no edema, well perfused with 2+ pulses, Skin: no rash or lesions noted Lymph: no LAD  Data: Lab Results  Component Value Date   WBC 29.2 (H) 11/10/2020   HGB 5.3 (L) 11/10/2020   HCT 17.3 (L) 11/10/2020   MCV 84.0 11/10/2020   PLT 836 (H) 11/10/2020   Recent Labs  Lab 11/10/20 0926  HGB 5.3*   Lab Results  Component Value Date   NA 135 11/10/2020   K 3.4 (L) 11/10/2020   CL 106 11/10/2020   CO2 16 (L) 11/10/2020   BUN 24 (H) 11/10/2020   CREATININE 1.55 (H) 11/10/2020   Lab Results  Component Value Date   ALT 41 11/10/2020   AST 33 11/10/2020   ALKPHOS 109 11/10/2020   BILITOT 0.2 (L) 11/10/2020   Recent Labs  Lab 11/10/20 1040  APTT <24*  INR 1.2   CT abd/pelvis with  contrast 11/10/2020: FINDINGS: Lower chest: Included lung bases are clear. Heart size is normal.  Hepatobiliary: No focal liver abnormality is seen. No gallstones, gallbladder wall thickening, or biliary dilatation.  Pancreas: Unremarkable. No pancreatic ductal dilatation or surrounding inflammatory changes.  Spleen: There are a few punctate calcifications within the spleen suggesting sequela of chronic granulomatous disease. Spleen otherwise normal in size and appearance.  Adrenals/Urinary Tract: Unremarkable adrenal glands. Kidneys enhance symmetrically without focal lesion, stone, or hydronephrosis. Ureters are nondilated. Urinary bladder appears unremarkable.  Stomach/Bowel: Findings of mild near pancolonic colitis with mild diffuse colonic wall thickening, loss of haustration, and subtle associated fat stranding. Findings are most evident within the proximal transverse colon and mid  descending colon. There are a few filling defects within the region of the hepatic flexure and proximal transverse colon which are felt to most likely represents stool. Underlying colonic mass would be difficult to exclude. Submucosal fat deposition within the terminal ileum. Incidental note of a small bowel-small bowel intussusception in the left hemiabdomen (series 5, image 62). No definite lead point mass lesion. No bowel obstruction. Normal appendix within the right lower quadrant (series 4, image 65). Stomach within normal limits.  Vascular/Lymphatic: No significant vascular findings are present. No enlarged abdominal or pelvic lymph nodes.  Reproductive: Prostate is unremarkable.  Other: No free fluid. No abdominopelvic fluid collection. No pneumoperitoneum. No abdominal wall hernia.  Musculoskeletal: Subtle subchondral cystic change versus tiny erosions of the bilateral SI joints, potentially reflecting sequela of sacroiliitis. No acute or significant osseous findings.  IMPRESSION: 1. Findings of mild diffuse pancolitis, most evident within the proximal transverse colon and mid descending colon. Findings are in keeping with patient's reported diagnosis of Crohn's disease. There are a few filling defects within the region of the hepatic flexure and proximal transverse colon which are felt to most likely represent stool. Underlying colonic mass would be difficult to exclude. Correlation with colonoscopy is recommended following resolution of patient's acute symptoms, if not recently performed. 2. Incidental note of a small bowel-small bowel intussusception in the left hemiabdomen. No definite lead point mass lesion. No bowel obstruction. Consider short-term follow-up with CT enterography. 3. Subtle subchondral cystic change versus tiny erosions of the bilateral SI joints, potentially reflecting sequela of sacroiliitis.   Assessment/Plan:  35 y/o Caucasian male with a PMH of PTSD and  tobacco use admitted for Crohn's colitis exacerbation   1. Crohn's colitis with acute exacerbation - followed as outpatient by [facility]. Recently received induction dose of ustekinumab 2-3 weeks ago. He has been on prednisone 40 mg daily as well.   2. Acute blood loss anemia 2/2 LGI Bleed - hemoglobin 5.3 on admission and he is s/p 2 units pRBCs.   3. Diffuse abdominal pain - 2/2 #1  4. Significant leukocytosis - 29.2K meeting sepsis protocol, code sepsis initiated. Likely result of #1.   5. Small bowel-small bowel intussusception - General Surgery consulted and considered this an incidental  6. Elevated Troponins - likely demand ischemia in setting of acute Crohn's exacerbation and acute blood loss anemia   Recommendations:  1. Agree with transfusion of 2 units pRBCs and follow-up on post-transfusion hemoglobin. Transfuse for Hgb <7.0. 2. Collect and follow-up on infectious stool studies to rule out infectious etiology (GI PCR and c diff) 3. Agree with antibiotics of cefepime and metronidazole 4. Continue serial abdominal examinations  5. Start IV Solu-Medrol 60 mg daily 6. Start clear liquid diet. Can advance to low residue diet if tolerates clears and  wants to increase. 7. Attempt to obtain [facility] gastroenterology records including prior colonoscopy procedure/path reports and office visit notes 8. No plans for any luminal evaluation at present, but will continue to follow patient closely. If no symptomatic improvement with above, will likely need luminal evaluation 9. Following closely along with you    Thank you for the consult. Please call with questions or concerns.  Reeves Forth [facility] [phone] (Cell)

## 2020-11-10 NOTE — ED Notes (Signed)
Hospitalist at bedside 

## 2020-11-10 NOTE — Progress Notes (Signed)
PHARMACY -  BRIEF ANTIBIOTIC NOTE   Pharmacy has received consult(s) for cefepime from an ED provider.  The patient's profile has been reviewed for ht/wt/allergies/indication/available labs.    One time order(s) placed for cefepime 2 g  Further antibiotics/pharmacy consults should be ordered by admitting physician if indicated.                       Thank you,  Pricilla Riffle, PharmD 11/10/2020  10:24 AM

## 2020-11-11 DIAGNOSIS — K921 Melena: Secondary | ICD-10-CM | POA: Diagnosis not present

## 2020-11-11 DIAGNOSIS — K561 Intussusception: Secondary | ICD-10-CM | POA: Diagnosis present

## 2020-11-11 DIAGNOSIS — W19XXXA Unspecified fall, initial encounter: Secondary | ICD-10-CM | POA: Diagnosis present

## 2020-11-11 DIAGNOSIS — R197 Diarrhea, unspecified: Secondary | ICD-10-CM

## 2020-11-11 DIAGNOSIS — Z888 Allergy status to other drugs, medicaments and biological substances status: Secondary | ICD-10-CM | POA: Diagnosis not present

## 2020-11-11 DIAGNOSIS — Z79899 Other long term (current) drug therapy: Secondary | ICD-10-CM | POA: Diagnosis not present

## 2020-11-11 DIAGNOSIS — D62 Acute posthemorrhagic anemia: Secondary | ICD-10-CM | POA: Diagnosis present

## 2020-11-11 DIAGNOSIS — I9589 Other hypotension: Secondary | ICD-10-CM | POA: Diagnosis present

## 2020-11-11 DIAGNOSIS — R778 Other specified abnormalities of plasma proteins: Secondary | ICD-10-CM

## 2020-11-11 DIAGNOSIS — D649 Anemia, unspecified: Secondary | ICD-10-CM | POA: Diagnosis not present

## 2020-11-11 DIAGNOSIS — K922 Gastrointestinal hemorrhage, unspecified: Secondary | ICD-10-CM | POA: Diagnosis not present

## 2020-11-11 DIAGNOSIS — R630 Anorexia: Secondary | ICD-10-CM | POA: Diagnosis present

## 2020-11-11 DIAGNOSIS — E876 Hypokalemia: Secondary | ICD-10-CM | POA: Diagnosis present

## 2020-11-11 DIAGNOSIS — E8809 Other disorders of plasma-protein metabolism, not elsewhere classified: Secondary | ICD-10-CM | POA: Diagnosis present

## 2020-11-11 DIAGNOSIS — K50811 Crohn's disease of both small and large intestine with rectal bleeding: Secondary | ICD-10-CM | POA: Diagnosis not present

## 2020-11-11 DIAGNOSIS — E860 Dehydration: Secondary | ICD-10-CM | POA: Diagnosis present

## 2020-11-11 DIAGNOSIS — Z91010 Allergy to peanuts: Secondary | ICD-10-CM | POA: Diagnosis not present

## 2020-11-11 DIAGNOSIS — R079 Chest pain, unspecified: Secondary | ICD-10-CM | POA: Diagnosis not present

## 2020-11-11 DIAGNOSIS — K5080 Crohn's disease of both small and large intestine without complications: Secondary | ICD-10-CM | POA: Diagnosis present

## 2020-11-11 DIAGNOSIS — G40909 Epilepsy, unspecified, not intractable, without status epilepticus: Secondary | ICD-10-CM | POA: Diagnosis present

## 2020-11-11 DIAGNOSIS — F4310 Post-traumatic stress disorder, unspecified: Secondary | ICD-10-CM | POA: Diagnosis present

## 2020-11-11 DIAGNOSIS — G8929 Other chronic pain: Secondary | ICD-10-CM | POA: Diagnosis present

## 2020-11-11 DIAGNOSIS — N179 Acute kidney failure, unspecified: Secondary | ICD-10-CM | POA: Diagnosis present

## 2020-11-11 DIAGNOSIS — R634 Abnormal weight loss: Secondary | ICD-10-CM | POA: Diagnosis present

## 2020-11-11 DIAGNOSIS — Z20822 Contact with and (suspected) exposure to covid-19: Secondary | ICD-10-CM | POA: Diagnosis present

## 2020-11-11 DIAGNOSIS — F4323 Adjustment disorder with mixed anxiety and depressed mood: Secondary | ICD-10-CM

## 2020-11-11 DIAGNOSIS — R531 Weakness: Secondary | ICD-10-CM | POA: Diagnosis present

## 2020-11-11 DIAGNOSIS — F17290 Nicotine dependence, other tobacco product, uncomplicated: Secondary | ICD-10-CM | POA: Diagnosis present

## 2020-11-11 DIAGNOSIS — I200 Unstable angina: Secondary | ICD-10-CM | POA: Diagnosis present

## 2020-11-11 DIAGNOSIS — I248 Other forms of acute ischemic heart disease: Secondary | ICD-10-CM | POA: Diagnosis present

## 2020-11-11 DIAGNOSIS — E861 Hypovolemia: Secondary | ICD-10-CM | POA: Diagnosis present

## 2020-11-11 LAB — GASTROINTESTINAL PANEL BY PCR, STOOL (REPLACES STOOL CULTURE)

## 2020-11-11 LAB — CBC
HCT: 21.9 % — ABNORMAL LOW (ref 39.0–52.0)
HCT: 27 % — ABNORMAL LOW (ref 39.0–52.0)
Hemoglobin: 7 g/dL — ABNORMAL LOW (ref 13.0–17.0)
Hemoglobin: 9 g/dL — ABNORMAL LOW (ref 13.0–17.0)
MCH: 25.7 pg — ABNORMAL LOW (ref 26.0–34.0)
MCH: 26.9 pg (ref 26.0–34.0)
MCHC: 32 g/dL (ref 30.0–36.0)
MCHC: 33.3 g/dL (ref 30.0–36.0)
MCV: 80.5 fL (ref 80.0–100.0)
MCV: 80.6 fL (ref 80.0–100.0)
Platelets: 440 10*3/uL — ABNORMAL HIGH (ref 150–400)
Platelets: 496 10*3/uL — ABNORMAL HIGH (ref 150–400)
RBC: 2.72 MIL/uL — ABNORMAL LOW (ref 4.22–5.81)
RBC: 3.35 MIL/uL — ABNORMAL LOW (ref 4.22–5.81)
RDW: 20 % — ABNORMAL HIGH (ref 11.5–15.5)
RDW: 19.1 % — ABNORMAL HIGH (ref 11.5–15.5)
WBC: 11.8 10*3/uL — ABNORMAL HIGH (ref 4.0–10.5)
WBC: 12.9 10*3/uL — ABNORMAL HIGH (ref 4.0–10.5)
nRBC: 0 % (ref 0.0–0.2)
nRBC: 0 % (ref 0.0–0.2)

## 2020-11-11 LAB — C DIFFICILE QUICK SCREEN W PCR REFLEX
C Diff antigen: NEGATIVE
C Diff interpretation: NOT DETECTED
C Diff toxin: NEGATIVE

## 2020-11-11 LAB — BASIC METABOLIC PANEL
Anion gap: 6 (ref 5–15)
BUN: 25 mg/dL — ABNORMAL HIGH (ref 6–20)
CO2: 23 mmol/L (ref 22–32)
Calcium: 7.7 mg/dL — ABNORMAL LOW (ref 8.9–10.3)
Chloride: 108 mmol/L (ref 98–111)
Creatinine, Ser: 1.4 mg/dL — ABNORMAL HIGH (ref 0.61–1.24)
GFR, Estimated: >60 mL/min (ref >60)
Glucose, Bld: 80 mg/dL (ref 70–99)
Potassium: 4.4 mmol/L (ref 3.5–5.1)
Sodium: 137 mmol/L (ref 135–145)

## 2020-11-11 LAB — PREPARE RBC (CROSSMATCH)

## 2020-11-11 LAB — HIV ANTIBODY (ROUTINE TESTING W REFLEX): HIV Screen 4th Generation wRfx: NONREACTIVE

## 2020-11-11 LAB — TROPONIN I (HIGH SENSITIVITY): Troponin I (High Sensitivity): 54 ng/L — ABNORMAL HIGH (ref <18)

## 2020-11-11 MED ORDER — OXYCODONE-ACETAMINOPHEN 7.5-325 MG PO TABS
1.0000 | ORAL_TABLET | Freq: Four times a day (QID) | ORAL | Status: DC | PRN
  Administered 2020-11-11 – 2020-11-14 (×4): 1 via ORAL
  Filled 2020-11-11 (×4): qty 1

## 2020-11-11 MED ORDER — MORPHINE SULFATE (PF) 2 MG/ML IV SOLN
2.0000 mg | INTRAVENOUS | Status: DC | PRN
  Administered 2020-11-11 – 2020-11-14 (×12): 2 mg via INTRAVENOUS
  Filled 2020-11-11 (×12): qty 1

## 2020-11-11 MED ORDER — SODIUM CHLORIDE 0.9% IV SOLUTION
Freq: Once | INTRAVENOUS | Status: AC
  Filled 2020-11-11: qty 250

## 2020-11-11 NOTE — Progress Notes (Signed)
PROGRESS NOTE    William Vaughan  ZOX:[id number] DOB: 1986/05/29 DOA: 11/10/2020 PCP: [facility]   Brief Narrative: Taken from H&P.  William Vaughan is a 35 y.o. male with medical history significant for Crohn's, PTSD, anxiety/depression, presented to the emergency department for chief concerns of chest pain.  He reports chest pain started around 3 AM, while having a bowel movement.  He describes the pain as pressured with burning of your lips and he felt palpitations, 8/10 and now a 6/10.  He reports the pain lasts hours. He denies numbness in his upper extremiites. He felt abnormal tastes in his mouth. He has never felt this way. He endorses [facility] for over one year and [facility].  He states his last PO intake was last night, 11/09/2020. He endorses weight lost in 1 month of approximately 30 lbs. he states he is trying to gain weight.  He reports that at the [place], he gets is on ustekinumab infusion two weeks ago at the [place]. this was his first treatment with ustekinumab. Previously he was on prednisone PO. He has been on humira and sulfasalazine 1000 mg PO BID. Per patient, these were unsuccessful at inducing remission for Crohn's.  On arrival he appears dehydrated, hemoglobin of 5.3, CT abdomen concerning for pancolitis, and intussusception, received 2 unit of PRBC.  Also started on cefepime and metronidazole. C. difficile and GI pathogen panel negative. Gastroenterology was consulted and they are recommending continuation of antibiotics, also started him on IV Solu-Medrol. He will need a repeat colonoscopy once current flare resolves.  Subjective: Patient was complaining of 9/10 abdominal pain.  He did had 3 loose bowel movements since morning with some blood in it.  Assessment & Plan:   Principal Problem:   GI bleed Active Problems:   Adjustment disorder with mixed anxiety and depressed mood   PTSD  (post-traumatic stress disorder)   Crohn's disease (HCC)   Severe anemia   AKI (acute kidney injury) (HCC)  Symptomatic anemia secondary to GI bleed.  Hemoglobin at 7 this morning after receiving 2 units of PRBC.  Continue to have some hematochezia. Suspecting some hemoconcentration secondary to dehydration on admission as all cell lines decreased today with IV hydration.  Original hemoglobin might be lower than 5. -Give him another unit of PRBC. -GI is on board-appreciate their help. -Continue to monitor CBG  Crohn's disease flare.  Per patient he has a diagnosis of Crohn's disease and most likely undergoing a flare.  No documentation available from [place]. First dose of ustekinumab was about two weeks ago via infusion, per pt, he is will get subcutaneous injection 8 weeks later. -GI started him on Solu-Medrol 60 mg daily. -Continue with pain management with morphine.  Her kids -Continue with cefepime and metronidazole.   Concern of intussusception.  General surgery was consulted and they signed off as there is no sign of obstruction and they are recommending observation. -Continue with clear liquid today.  Elevated troponin.  Troponin peaked at 429, started improving. May be secondary to demand ischemia with severe anemia. Cardiology consult as he presented with chest pain. -Echocardiogram  AKI.  Most likely prerenal, unknown baseline -Continue to monitor -Avoid nephrotoxins  History of seizure disorder.  Per patient he might have a possible diagnosis of seizures, no acute concern and he does not remember when he had seizure last time.  Unable to obtain records from [place] at this time. Apparently  he was on topiramate at home but does not remember the dosing. -Continue Topamax as listed in his med rec. -Ativan as needed if any seizure noted.  PTSD/anxiety/depression.  -Continue home dose of Seroquel, Effexor and nortriptyline.  Objective: Vitals:   11/11/20 1045 11/11/20 1100  11/11/20 1239 11/11/20 1400  BP: 134/63 129/65 137/74 140/76  Pulse: 98 98 90 89  Resp: 15 15 14 16   Temp:   98.4 F (36.9 C)   TempSrc:   Oral   SpO2: 97% 97% 98% 100%  Weight:      Height:        Intake/Output Summary (Last 24 hours) at 11/11/2020 1445 Last data filed at 11/11/2020 1238 Gross per 24 hour  Intake 4368.91 ml  Output --  Net 4368.91 ml   Filed Weights   11/10/20 0918 11/10/20 0925  Weight: 90.3 kg 78.5 kg    Examination:  General exam: Appears calm and comfortable  Respiratory system: Clear to auscultation. Respiratory effort normal. Cardiovascular system: S1 & S2 heard, RRR.  Gastrointestinal system: Soft, diffusely tender abdomen, no guarding, nondistended, bowel sounds positive. Central nervous system: Alert and oriented. No focal neurological deficits. Extremities: No edema, no cyanosis, pulses intact and symmetrical. Psychiatry: Judgement and insight appear normal.    DVT prophylaxis: SCDs Code Status: Full Family Communication: Patient does not want me to call any family member, stating that he will communicate with them himself. Disposition Plan:  Status is: Inpatient  Remains inpatient appropriate because:Inpatient level of care appropriate due to severity of illness   Dispo: The patient is from: Home              Anticipated d/c is to: Home              Anticipated d/c date is: 2 days              Patient currently is not medically stable to d/c.   Difficult to place patient No               Level of care: Stepdown  Consultants:   Gastroenterology  Cardiology  Procedures:  Antimicrobials:  Cefepime Metronidazole  Data Reviewed: I have personally reviewed following labs and imaging studies  CBC: Recent Labs  Lab 11/10/20 0926 11/10/20 1904 11/11/20 0423 11/11/20 1409  WBC 29.2*  --  11.8* 12.9*  NEUTROABS 22.0*  --   --   --   HGB 5.3* 6.8* 7.0* 9.0*  HCT 17.3* 21.6* 21.9* 27.0*  MCV 84.0  --  80.5 80.6  PLT 836*  --   440* 496*   Basic Metabolic Panel: Recent Labs  Lab 11/10/20 0926 11/11/20 0423  NA 135 137  K 3.4* 4.4  CL 106 108  CO2 16* 23  GLUCOSE 286* 80  BUN 24* 25*  CREATININE 1.55* 1.40*  CALCIUM 7.8* 7.7*   GFR: Estimated Creatinine Clearance: 78.4 mL/min (A) (by C-G formula based on SCr of 1.4 mg/dL (H)). Liver Function Tests: Recent Labs  Lab 11/10/20 0926  AST 33  ALT 41  ALKPHOS 109  BILITOT 0.2*  PROT 4.8*  ALBUMIN 2.2*   No results for input(s): LIPASE, AMYLASE in the last 168 hours. No results for input(s): AMMONIA in the last 168 hours. Coagulation Profile: Recent Labs  Lab 11/10/20 1040  INR 1.2   Cardiac Enzymes: Recent Labs  Lab 11/10/20 0926  CKTOTAL 60   BNP (last 3 results) No results for input(s): PROBNP in the last 8760 hours. HbA1C:  No results for input(s): HGBA1C in the last 72 hours. CBG: Recent Labs  Lab 11/10/20 0937  GLUCAP 260*   Lipid Profile: No results for input(s): CHOL, HDL, LDLCALC, TRIG, CHOLHDL, LDLDIRECT in the last 72 hours. Thyroid Function Tests: Recent Labs    11/10/20 1049  TSH 2.301   Anemia Panel: No results for input(s): VITAMINB12, FOLATE, FERRITIN, TIBC, IRON, RETICCTPCT in the last 72 hours. Sepsis Labs: Recent Labs  Lab 11/10/20 0926 11/10/20 1320  LATICACIDVEN 7.6* 1.6    Recent Results (from the past 240 hour(s))  Blood Culture (routine x 2)     Status: None (Preliminary result)   Collection Time: 11/10/20  9:36 AM   Specimen: BLOOD  Result Value Ref Range Status   Specimen Description BLOOD LEFT UPPER ARM  Final   Special Requests   Final    BOTTLES DRAWN AEROBIC AND ANAEROBIC Blood Culture results may not be optimal due to an excessive volume of blood received in culture bottles   Culture   Final    NO GROWTH < 24 HOURS Performed at [facility], [address], [place], [place] [address]    Report Status PENDING  Incomplete  SARS Coronavirus 2 by RT PCR (hospital order, performed  in [facility] hospital lab) Nasopharyngeal Nasopharyngeal Swab     Status: None   Collection Time: 11/10/20 10:40 AM   Specimen: Nasopharyngeal Swab  Result Value Ref Range Status   SARS Coronavirus 2 NEGATIVE NEGATIVE Final    Comment: (NOTE) SARS-CoV-2 target nucleic acids are NOT DETECTED.  The SARS-CoV-2 RNA is generally detectable in upper and lower respiratory specimens during the acute phase of infection. The lowest concentration of SARS-CoV-2 viral copies this assay can detect is 250 copies / mL. A negative result does not preclude SARS-CoV-2 infection and should not be used as the sole basis for treatment or other patient management decisions.  A negative result may occur with improper specimen collection / handling, submission of specimen other than nasopharyngeal swab, presence of viral mutation(s) within the areas targeted by this assay, and inadequate number of viral copies (<250 copies / mL). A negative result must be combined with clinical observations, patient history, and epidemiological information.  Fact Sheet for Patients:   BoilerBrush.com.cy  Fact Sheet for Healthcare Providers: https://pope.com/  This test is not yet approved or  cleared by the [place] FDA and has been authorized for detection and/or diagnosis of SARS-CoV-2 by FDA under an Emergency Use Authorization (EUA).  This EUA will remain in effect (meaning this test can be used) for the duration of the COVID-19 declaration under Section 564(b)(1) of the Act, 21 U.S.C. section 360bbb-3(b)(1), unless the authorization is terminated or revoked sooner.  Performed at [facility] [address] [place], [place] [address]   Blood Culture (routine x 2)     Status: None (Preliminary result)   Collection Time: 11/10/20 10:40 AM   Specimen: BLOOD  Result Value Ref Range Status   Specimen Description BLOOD RIGHT ANTECUBITAL  Final   Special  Requests   Final    BOTTLES DRAWN AEROBIC AND ANAEROBIC Blood Culture results may not be optimal due to an inadequate volume of blood received in culture bottles   Culture   Final    NO GROWTH < 24 HOURS Performed at [facility], [address], [place], [place] [address]    Report Status PENDING  Incomplete  Gastrointestinal Panel by PCR , Stool     Status:  None   Collection Time: 11/11/20  7:25 AM   Specimen: Stool  Result Value Ref Range Status   Campylobacter species NOT DETECTED NOT DETECTED Final   Plesimonas shigelloides NOT DETECTED NOT DETECTED Final   Salmonella species NOT DETECTED NOT DETECTED Final   Yersinia enterocolitica NOT DETECTED NOT DETECTED Final   Vibrio species NOT DETECTED NOT DETECTED Final   Vibrio cholerae NOT DETECTED NOT DETECTED Final   Enteroaggregative E coli (EAEC) NOT DETECTED NOT DETECTED Final   Enteropathogenic E coli (EPEC) NOT DETECTED NOT DETECTED Final   Enterotoxigenic E coli (ETEC) NOT DETECTED NOT DETECTED Final   Shiga like toxin producing E coli (STEC) NOT DETECTED NOT DETECTED Final   Shigella/Enteroinvasive E coli (EIEC) NOT DETECTED NOT DETECTED Final   Cryptosporidium NOT DETECTED NOT DETECTED Final   Cyclospora cayetanensis NOT DETECTED NOT DETECTED Final   Entamoeba histolytica NOT DETECTED NOT DETECTED Final   Giardia lamblia NOT DETECTED NOT DETECTED Final   Adenovirus F40/41 NOT DETECTED NOT DETECTED Final   Astrovirus NOT DETECTED NOT DETECTED Final   Norovirus GI/GII NOT DETECTED NOT DETECTED Final   Rotavirus A NOT DETECTED NOT DETECTED Final   Sapovirus (I, II, IV, and V) NOT DETECTED NOT DETECTED Final    Comment: Performed at [facility], [address] [place], [place] [address]  C Difficile Quick Screen w PCR reflex     Status: None   Collection Time: 11/11/20  7:25 AM   Specimen: Stool  Result Value Ref Range Status   C Diff antigen NEGATIVE NEGATIVE Final   C Diff toxin NEGATIVE NEGATIVE  Final   C Diff interpretation No C. difficile detected.  Final    Comment: Performed at [facility], [address] [place], [place] [address]     Radiology Studies: CT Angio Chest PE W and/or Wo Contrast  Result Date: 11/10/2020 CLINICAL DATA:  Chest pain. EXAM: CT ANGIOGRAPHY CHEST WITH CONTRAST TECHNIQUE: Multidetector CT imaging of the chest was performed using the standard protocol during bolus administration of intravenous contrast. Multiplanar CT image reconstructions and MIPs were obtained to evaluate the vascular anatomy. CONTRAST:  75mL OMNIPAQUE IOHEXOL 350 MG/ML SOLN COMPARISON:  Chest radiograph November 10, 2020 FINDINGS: Cardiovascular: There is no demonstrable pulmonary embolus. There is no thoracic aortic aneurysm or dissection. Visualized great vessels appear unremarkable. No evident pericardial effusion or pericardial thickening. Mediastinum/Nodes: Visualized thyroid appears unremarkable. No evident thoracic adenopathy. No esophageal lesions are appreciable. Lungs/Pleura: There are scattered areas of mild atelectatic change. No edema or airspace opacity. No appreciable pleural effusions. No pneumothorax. Trachea and major bronchial structures appear patent. Upper Abdomen: Visualized upper abdominal structures appear unremarkable. Musculoskeletal: There is mild thoracic dextroscoliosis. No blastic or lytic bone lesions are evident. There are no chest wall lesions. Review of the MIP images confirms the above findings. IMPRESSION: 1. No evident pulmonary embolus. No thoracic aortic aneurysm or dissection. 2. Scattered areas of mild atelectatic change. No edema or airspace opacity. No pleural effusions. 3.  No evident adenopathy. Electronically Signed   By: Bretta BangWilliam  Woodruff III M.D.   On: 11/10/2020 11:47   CT ABDOMEN PELVIS W CONTRAST  Result Date: 11/10/2020 CLINICAL DATA:  Diarrhea.  History of Crohn's disease EXAM: CT ABDOMEN AND PELVIS WITH CONTRAST TECHNIQUE:  Multidetector CT imaging of the abdomen and pelvis was performed using the standard protocol following bolus administration of intravenous contrast. CONTRAST:  75mL OMNIPAQUE IOHEXOL 350 MG/ML SOLN COMPARISON:  None. FINDINGS: Lower chest: Included lung  bases are clear.  Heart size is normal. Hepatobiliary: No focal liver abnormality is seen. No gallstones, gallbladder wall thickening, or biliary dilatation. Pancreas: Unremarkable. No pancreatic ductal dilatation or surrounding inflammatory changes. Spleen: There are a few punctate calcifications within the spleen suggesting sequela of chronic granulomatous disease. Spleen otherwise normal in size and appearance. Adrenals/Urinary Tract: Unremarkable adrenal glands. Kidneys enhance symmetrically without focal lesion, stone, or hydronephrosis. Ureters are nondilated. Urinary bladder appears unremarkable. Stomach/Bowel: Findings of mild near pancolonic colitis with mild diffuse colonic wall thickening, loss of haustration, and subtle associated fat stranding. Findings are most evident within the proximal transverse colon and mid descending colon. There are a few filling defects within the region of the hepatic flexure and proximal transverse colon which are felt to most likely represents stool. Underlying colonic mass would be difficult to exclude. Submucosal fat deposition within the terminal ileum. Incidental note of a small bowel-small bowel intussusception in the left hemiabdomen (series 5, image 54). No definite lead point mass lesion. No bowel obstruction. Normal appendix within the right lower quadrant (series 4, image 65). Stomach within normal limits. Vascular/Lymphatic: No significant vascular findings are present. No enlarged abdominal or pelvic lymph nodes. Reproductive: Prostate is unremarkable. Other: No William fluid. No abdominopelvic fluid collection. No pneumoperitoneum. No abdominal wall hernia. Musculoskeletal: Subtle subchondral cystic change versus  tiny erosions of the bilateral SI joints, potentially reflecting sequela of sacroiliitis. No acute or significant osseous findings. IMPRESSION: 1. Findings of mild diffuse pancolitis, most evident within the proximal transverse colon and mid descending colon. Findings are in keeping with patient's reported diagnosis of Crohn's disease. There are a few filling defects within the region of the hepatic flexure and proximal transverse colon which are felt to most likely represent stool. Underlying colonic mass would be difficult to exclude. Correlation with colonoscopy is recommended following resolution of patient's acute symptoms, if not recently performed. 2. Incidental note of a small bowel-small bowel intussusception in the left hemiabdomen. No definite lead point mass lesion. No bowel obstruction. Consider short-term follow-up with CT enterography. 3. Subtle subchondral cystic change versus tiny erosions of the bilateral SI joints, potentially reflecting sequela of sacroiliitis. Electronically Signed   By: Duanne Guess D.O.   On: 11/10/2020 12:02   DG Chest Port 1 View  Result Date: 11/10/2020 CLINICAL DATA:  Diarrhea.  Multiple falls.  Right-sided rib pain. EXAM: PORTABLE CHEST 1 VIEW COMPARISON:  Chest x-ray 07/20/2014. FINDINGS: Mediastinum and hilar structures normal. Heart size normal. Low lung volumes. No focal infiltrate. No pleural effusion or pneumothorax. Mild scoliosis thoracic spine. IMPRESSION: No acute cardiopulmonary disease. Electronically Signed   By: Maisie Fus  Register   On: 11/10/2020 10:25    Scheduled Meds: . ferrous sulfate  325 mg Oral BID WC  . folic acid  1 mg Oral Daily  . methylPREDNISolone (SOLU-MEDROL) injection  60 mg Intravenous Daily  . nortriptyline  50 mg Oral QHS  . QUEtiapine  100 mg Oral QHS  . topiramate  50 mg Oral Daily  . venlafaxine  50 mg Oral BID WC   Continuous Infusions: . ceFEPime (MAXIPIME) IV Stopped (11/11/20 0954)  . metronidazole Stopped  (11/11/20 0923)     LOS: 0 days   Time spent: 40 minutes.  Arnetha Courser, MD [facility]  If 7PM-7AM, please contact night-coverage [email or web address]  11/11/2020, 2:45 PM   This record has been created using Conservation officer, historic buildings. Errors have been sought and corrected,but may not always be located. Such creation errors do  not reflect on the standard of care.

## 2020-11-11 NOTE — ED Notes (Signed)
Pt asleep in room. Vss. Blood infusing. NAD

## 2020-11-11 NOTE — ED Notes (Signed)
Gave pt lunch tray, pt awake, requested lights off

## 2020-11-11 NOTE — Progress Notes (Signed)
[facility] Inpatient Progress Note  Subjective: Patient seen for f/u lower GI bleed. Patient continues with mild lower abdominal pain, continued hematochezia, slightly slowed.   Objective: Vital signs in last 24 hours: Temp:  [97.8 F (36.6 C)-98.6 F (37 C)] 98.6 F (37 C) (01/27 1001) Pulse Rate:  [81-115] 95 (01/27 1000) Resp:  [10-20] 10 (01/27 1000) BP: (90-166)/(41-78) 131/72 (01/27 1000) SpO2:  [93 %-100 %] 100 % (01/27 1000) Blood pressure 131/72, pulse 95, temperature 98.6 F (37 C), temperature source Oral, resp. rate 10, height 5\' 11"  (1.803 m), weight 78.5 kg, SpO2 100 %.    Intake/Output from previous day: 01/26 0701 - 01/27 0700 In: 2140 [Blood:1440; IV Piggyback:700] Out: -   Intake/Output this shift: Total I/O In: 370 [Blood:370] Out: -    Gen: NAD. Appears uncomfortable.  HEENT: [place]/AT. PERRLA. Normal external ear exam.  Chest: CTA, no wheezes.  CV: RR nl S1, S2. No gallops.  Abd: soft, diffusely tender without rebound. Mildly distended without mass or hepatosplenomegaly. BS+  Ext: no edema. Pulses 2+  Neuro: Alert and oriented. Judgement appears normal. Nonfocal.    Lab Results: Results for orders placed or performed during the hospital encounter of 11/10/20 (from the past 24 hour(s))  Urinalysis, Complete w Microscopic Urine, Clean Catch     Status: Abnormal   Collection Time: 11/10/20 10:40 AM  Result Value Ref Range   Color, Urine YELLOW (A) YELLOW   APPearance CLEAR (A) CLEAR   Specific Gravity, Urine 1.039 (H) 1.005 - 1.030   pH 5.0 5.0 - 8.0   Glucose, UA NEGATIVE NEGATIVE mg/dL   Hgb urine dipstick SMALL (A) NEGATIVE   Bilirubin Urine NEGATIVE NEGATIVE   Ketones, ur NEGATIVE NEGATIVE mg/dL   Protein, ur NEGATIVE NEGATIVE mg/dL   Nitrite NEGATIVE NEGATIVE   Leukocytes,Ua NEGATIVE NEGATIVE   RBC / HPF 0-5 0 - 5 RBC/hpf   WBC, UA 0-5 0 - 5 WBC/hpf   Bacteria, UA NONE SEEN NONE SEEN   Squamous Epithelial / LPF  0-5 0 - 5   Mucus PRESENT   SARS Coronavirus 2 by RT PCR (hospital order, performed in [facility] hospital lab) Nasopharyngeal Nasopharyngeal Swab     Status: None   Collection Time: 11/10/20 10:40 AM   Specimen: Nasopharyngeal Swab  Result Value Ref Range   SARS Coronavirus 2 NEGATIVE NEGATIVE  Protime-INR     Status: None   Collection Time: 11/10/20 10:40 AM  Result Value Ref Range   Prothrombin Time 14.4 11.4 - 15.2 seconds   INR 1.2 0.8 - 1.2  APTT     Status: Abnormal   Collection Time: 11/10/20 10:40 AM  Result Value Ref Range   aPTT <24 (L) 24 - 36 seconds  Blood Culture (routine x 2)     Status: None (Preliminary result)   Collection Time: 11/10/20 10:40 AM   Specimen: BLOOD  Result Value Ref Range   Specimen Description BLOOD RIGHT ANTECUBITAL    Special Requests      BOTTLES DRAWN AEROBIC AND ANAEROBIC Blood Culture results may not be optimal due to an inadequate volume of blood received in culture bottles   Culture      NO GROWTH < 24 HOURS Performed at [facility], [address] [place], [place] [id number]    Report Status PENDING   Troponin I (High Sensitivity)     Status: Abnormal   Collection Time: 11/10/20 10:40 AM  Result Value Ref Range   Troponin I (  High Sensitivity) 281 (HH) <18 ng/L  TSH     Status: None   Collection Time: 11/10/20 10:49 AM  Result Value Ref Range   TSH 2.301 0.350 - 4.500 uIU/mL  Prepare RBC (crossmatch)     Status: None   Collection Time: 11/10/20 11:52 AM  Result Value Ref Range   Order Confirmation      ORDER PROCESSED BY BLOOD BANK Performed at [facility], [address] [place], [place] [address]   Lactic acid, plasma     Status: None   Collection Time: 11/10/20  1:20 PM  Result Value Ref Range   Lactic Acid, Venous 1.6 0.5 - 1.9 mmol/L  Troponin I (High Sensitivity)     Status: Abnormal   Collection Time: 11/10/20  1:20 PM  Result Value Ref Range   Troponin I (High Sensitivity) 429 (HH) <18 ng/L   HIV Antibody (routine testing w rflx)     Status: None   Collection Time: 11/10/20  7:04 PM  Result Value Ref Range   HIV Screen 4th Generation wRfx Non Reactive Non Reactive  Hemoglobin and hematocrit, blood     Status: Abnormal   Collection Time: 11/10/20  7:04 PM  Result Value Ref Range   Hemoglobin 6.8 (L) 13.0 - 17.0 g/dL   HCT 14.7 (L) 82.9 - 56.2 %  Troponin I (High Sensitivity)     Status: Abnormal   Collection Time: 11/10/20  7:04 PM  Result Value Ref Range   Troponin I (High Sensitivity) 193 (HH) <18 ng/L  Prepare RBC (crossmatch)     Status: None   Collection Time: 11/10/20  9:28 PM  Result Value Ref Range   Order Confirmation      ORDER PROCESSED BY BLOOD BANK Performed at [facility] [address] [place], [place] [address]   Basic metabolic panel     Status: Abnormal   Collection Time: 11/11/20  4:23 AM  Result Value Ref Range   Sodium 137 135 - 145 mmol/L   Potassium 4.4 3.5 - 5.1 mmol/L   Chloride 108 98 - 111 mmol/L   CO2 23 22 - 32 mmol/L   Glucose, Bld 80 70 - 99 mg/dL   BUN 25 (H) 6 - 20 mg/dL   Creatinine, Ser 5.78 (H) 0.61 - 1.24 mg/dL   Calcium 7.7 (L) 8.9 - 10.3 mg/dL   GFR, Estimated >46 >96 mL/min   Anion gap 6 5 - 15  CBC     Status: Abnormal   Collection Time: 11/11/20  4:23 AM  Result Value Ref Range   WBC 11.8 (H) 4.0 - 10.5 K/uL   RBC 2.72 (L) 4.22 - 5.81 MIL/uL   Hemoglobin 7.0 (L) 13.0 - 17.0 g/dL   HCT 29.5 (L) 28.4 - 13.2 %   MCV 80.5 80.0 - 100.0 fL   MCH 25.7 (L) 26.0 - 34.0 pg   MCHC 32.0 30.0 - 36.0 g/dL   RDW 44.0 (H) 10.2 - 72.5 %   Platelets 440 (H) 150 - 400 K/uL   nRBC 0.0 0.0 - 0.2 %  Troponin I (High Sensitivity)     Status: Abnormal   Collection Time: 11/11/20  4:23 AM  Result Value Ref Range   Troponin I (High Sensitivity) 54 (H) <18 ng/L  Gastrointestinal Panel by PCR , Stool     Status: None   Collection Time: 11/11/20  7:25 AM   Specimen: Stool  Result Value Ref Range   Campylobacter species NOT  DETECTED  NOT DETECTED   Plesimonas shigelloides NOT DETECTED NOT DETECTED   Salmonella species NOT DETECTED NOT DETECTED   Yersinia enterocolitica NOT DETECTED NOT DETECTED   Vibrio species NOT DETECTED NOT DETECTED   Vibrio cholerae NOT DETECTED NOT DETECTED   Enteroaggregative E coli (EAEC) NOT DETECTED NOT DETECTED   Enteropathogenic E coli (EPEC) NOT DETECTED NOT DETECTED   Enterotoxigenic E coli (ETEC) NOT DETECTED NOT DETECTED   Shiga like toxin producing E coli (STEC) NOT DETECTED NOT DETECTED   Shigella/Enteroinvasive E coli (EIEC) NOT DETECTED NOT DETECTED   Cryptosporidium NOT DETECTED NOT DETECTED   Cyclospora cayetanensis NOT DETECTED NOT DETECTED   Entamoeba histolytica NOT DETECTED NOT DETECTED   Giardia lamblia NOT DETECTED NOT DETECTED   Adenovirus F40/41 NOT DETECTED NOT DETECTED   Astrovirus NOT DETECTED NOT DETECTED   Norovirus GI/GII NOT DETECTED NOT DETECTED   Rotavirus A NOT DETECTED NOT DETECTED   Sapovirus (I, II, IV, and V) NOT DETECTED NOT DETECTED  C Difficile Quick Screen w PCR reflex     Status: None   Collection Time: 11/11/20  7:25 AM   Specimen: STOOL  Result Value Ref Range   C Diff antigen NEGATIVE NEGATIVE   C Diff toxin NEGATIVE NEGATIVE   C Diff interpretation No C. difficile detected.   Prepare RBC (crossmatch)     Status: None   Collection Time: 11/11/20  9:08 AM  Result Value Ref Range   Order Confirmation      ORDER PROCESSED BY BLOOD BANK Performed at [facility], [address] [place], [place] [address]      Recent Labs    11/10/20 [phone] 11/10/20 1904 11/11/20 0423  WBC 29.2*  --  11.8*  HGB 5.3* 6.8* 7.0*  HCT 17.3* 21.6* 21.9*  PLT 836*  --  440*   BMET Recent Labs    11/10/20 0926 11/11/20 0423  NA 135 137  K 3.4* 4.4  CL 106 108  CO2 16* 23  GLUCOSE 286* 80  BUN 24* 25*  CREATININE 1.55* 1.40*  CALCIUM 7.8* 7.7*   LFT Recent Labs    11/10/20 0926  PROT 4.8*  ALBUMIN 2.2*  AST 33  ALT 41   ALKPHOS 109  BILITOT 0.2*   PT/INR Recent Labs    11/10/20 1040  LABPROT 14.4  INR 1.2   Hepatitis Panel No results for input(s): HEPBSAG, HCVAB, HEPAIGM, HEPBIGM in the last 72 hours. C-Diff Recent Labs    11/11/20 0725  CDIFFTOX NEGATIVE   No results for input(s): CDIFFPCR in the last 72 hours.   Studies/Results: CT Angio Chest PE W and/or Wo Contrast  Result Date: 11/10/2020 CLINICAL DATA:  Chest pain. EXAM: CT ANGIOGRAPHY CHEST WITH CONTRAST TECHNIQUE: Multidetector CT imaging of the chest was performed using the standard protocol during bolus administration of intravenous contrast. Multiplanar CT image reconstructions and MIPs were obtained to evaluate the vascular anatomy. CONTRAST:  43mL OMNIPAQUE IOHEXOL 350 MG/ML SOLN COMPARISON:  Chest radiograph November 10, 2020 FINDINGS: Cardiovascular: There is no demonstrable pulmonary embolus. There is no thoracic aortic aneurysm or dissection. Visualized great vessels appear unremarkable. No evident pericardial effusion or pericardial thickening. Mediastinum/Nodes: Visualized thyroid appears unremarkable. No evident thoracic adenopathy. No esophageal lesions are appreciable. Lungs/Pleura: There are scattered areas of mild atelectatic change. No edema or airspace opacity. No appreciable pleural effusions. No pneumothorax. Trachea and major bronchial structures appear patent. Upper Abdomen: Visualized upper abdominal structures appear unremarkable. Musculoskeletal: There is mild thoracic dextroscoliosis. No blastic  or lytic bone lesions are evident. There are no chest wall lesions. Review of the MIP images confirms the above findings. IMPRESSION: 1. No evident pulmonary embolus. No thoracic aortic aneurysm or dissection. 2. Scattered areas of mild atelectatic change. No edema or airspace opacity. No pleural effusions. 3.  No evident adenopathy. Electronically Signed   By: Bretta Bang III M.D.   On: 11/10/2020 11:47   CT ABDOMEN PELVIS  W CONTRAST  Result Date: 11/10/2020 CLINICAL DATA:  Diarrhea.  History of Crohn's disease EXAM: CT ABDOMEN AND PELVIS WITH CONTRAST TECHNIQUE: Multidetector CT imaging of the abdomen and pelvis was performed using the standard protocol following bolus administration of intravenous contrast. CONTRAST:  28mL OMNIPAQUE IOHEXOL 350 MG/ML SOLN COMPARISON:  None. FINDINGS: Lower chest: Included lung bases are clear.  Heart size is normal. Hepatobiliary: No focal liver abnormality is seen. No gallstones, gallbladder wall thickening, or biliary dilatation. Pancreas: Unremarkable. No pancreatic ductal dilatation or surrounding inflammatory changes. Spleen: There are a few punctate calcifications within the spleen suggesting sequela of chronic granulomatous disease. Spleen otherwise normal in size and appearance. Adrenals/Urinary Tract: Unremarkable adrenal glands. Kidneys enhance symmetrically without focal lesion, stone, or hydronephrosis. Ureters are nondilated. Urinary bladder appears unremarkable. Stomach/Bowel: Findings of mild near pancolonic colitis with mild diffuse colonic wall thickening, loss of haustration, and subtle associated fat stranding. Findings are most evident within the proximal transverse colon and mid descending colon. There are a few filling defects within the region of the hepatic flexure and proximal transverse colon which are felt to most likely represents stool. Underlying colonic mass would be difficult to exclude. Submucosal fat deposition within the terminal ileum. Incidental note of a small bowel-small bowel intussusception in the left hemiabdomen (series 5, image 54). No definite lead point mass lesion. No bowel obstruction. Normal appendix within the right lower quadrant (series 4, image 65). Stomach within normal limits. Vascular/Lymphatic: No significant vascular findings are present. No enlarged abdominal or pelvic lymph nodes. Reproductive: Prostate is unremarkable. Other: No free  fluid. No abdominopelvic fluid collection. No pneumoperitoneum. No abdominal wall hernia. Musculoskeletal: Subtle subchondral cystic change versus tiny erosions of the bilateral SI joints, potentially reflecting sequela of sacroiliitis. No acute or significant osseous findings. IMPRESSION: 1. Findings of mild diffuse pancolitis, most evident within the proximal transverse colon and mid descending colon. Findings are in keeping with patient's reported diagnosis of Crohn's disease. There are a few filling defects within the region of the hepatic flexure and proximal transverse colon which are felt to most likely represent stool. Underlying colonic mass would be difficult to exclude. Correlation with colonoscopy is recommended following resolution of patient's acute symptoms, if not recently performed. 2. Incidental note of a small bowel-small bowel intussusception in the left hemiabdomen. No definite lead point mass lesion. No bowel obstruction. Consider short-term follow-up with CT enterography. 3. Subtle subchondral cystic change versus tiny erosions of the bilateral SI joints, potentially reflecting sequela of sacroiliitis. Electronically Signed   By: Duanne Guess D.O.   On: 11/10/2020 12:02   DG Chest Port 1 View  Result Date: 11/10/2020 CLINICAL DATA:  Diarrhea.  Multiple falls.  Right-sided rib pain. EXAM: PORTABLE CHEST 1 VIEW COMPARISON:  Chest x-ray 07/20/2014. FINDINGS: Mediastinum and hilar structures normal. Heart size normal. Low lung volumes. No focal infiltrate. No pleural effusion or pneumothorax. Mild scoliosis thoracic spine. IMPRESSION: No acute cardiopulmonary disease. Electronically Signed   By: Maisie Fus  Register   On: 11/10/2020 10:25    Scheduled Inpatient Medications:   .  sodium chloride   Intravenous Once  . ferrous sulfate  325 mg Oral BID WC  . folic acid  1 mg Oral Daily  . methylPREDNISolone (SOLU-MEDROL) injection  60 mg Intravenous Daily  . nortriptyline  50 mg Oral QHS   . QUEtiapine  100 mg Oral QHS  . topiramate  50 mg Oral Daily  . venlafaxine  50 mg Oral BID WC    Continuous Inpatient Infusions:   . ceFEPime (MAXIPIME) IV Stopped (11/11/20 0954)  . metronidazole Stopped (11/11/20 0923)    PRN Inpatient Medications:  acetaminophen **OR** acetaminophen, hydrOXYzine, LORazepam, morphine injection, ondansetron **OR** ondansetron (ZOFRAN) IV    Assessment:  1. Hematochezia - DDx includes Crohn's exacerbation, anal outlet source, infectious colitis, CDAD. 2. Crohn's disease of the terminal ileum and colon. 3. Leukocytosis - Improvement with WBC down to 10k.  Plan:  1. Continue liquid diet, IV steroids. 2. Continue intravenous replacement of fluid losses. 3. Flex sig with biopsy if no significant improvement.  Noeli Lavery K. Norma Fredrickson, M.D. 11/11/2020, 10:07 AM

## 2020-11-12 ENCOUNTER — Encounter: Payer: Self-pay | Admitting: Internal Medicine

## 2020-11-12 ENCOUNTER — Inpatient Hospital Stay (HOSPITAL_COMMUNITY)
Admit: 2020-11-12 | Discharge: 2020-11-12 | Disposition: A | Discharge status: Home / Self Care | Payer: No Typology Code available for payment source | Attending: Internal Medicine | Admitting: Internal Medicine

## 2020-11-12 DIAGNOSIS — D649 Anemia, unspecified: Secondary | ICD-10-CM

## 2020-11-12 DIAGNOSIS — K921 Melena: Secondary | ICD-10-CM | POA: Diagnosis not present

## 2020-11-12 DIAGNOSIS — K922 Gastrointestinal hemorrhage, unspecified: Secondary | ICD-10-CM

## 2020-11-12 DIAGNOSIS — R079 Chest pain, unspecified: Secondary | ICD-10-CM

## 2020-11-12 DIAGNOSIS — I9589 Other hypotension: Secondary | ICD-10-CM

## 2020-11-12 DIAGNOSIS — I248 Other forms of acute ischemic heart disease: Secondary | ICD-10-CM

## 2020-11-12 DIAGNOSIS — K50811 Crohn's disease of both small and large intestine with rectal bleeding: Secondary | ICD-10-CM | POA: Diagnosis not present

## 2020-11-12 DIAGNOSIS — E861 Hypovolemia: Secondary | ICD-10-CM

## 2020-11-12 DIAGNOSIS — F4323 Adjustment disorder with mixed anxiety and depressed mood: Secondary | ICD-10-CM | POA: Diagnosis not present

## 2020-11-12 DIAGNOSIS — N179 Acute kidney failure, unspecified: Secondary | ICD-10-CM

## 2020-11-12 LAB — TYPE AND SCREEN
ABO/RH(D): A POS
Antibody Screen: NEGATIVE
Unit division: 0
Unit division: 0
Unit division: 0
Unit division: 0

## 2020-11-12 LAB — BPAM RBC
Blood Product Expiration Date: 202201302359
Blood Product Expiration Date: 202202212359
Blood Product Expiration Date: 202202262359
Blood Product Expiration Date: 202202262359
ISSUE DATE / TIME: 202201261247
ISSUE DATE / TIME: 202201261602
ISSUE DATE / TIME: 202201262211
ISSUE DATE / TIME: 202201270953
Unit Type and Rh: 6200
Unit Type and Rh: 6200
Unit Type and Rh: 6200
Unit Type and Rh: 9500

## 2020-11-12 LAB — COMPREHENSIVE METABOLIC PANEL
ALT: 28 U/L (ref 0–44)
AST: 17 U/L (ref 15–41)
Albumin: 2.4 g/dL — ABNORMAL LOW (ref 3.5–5.0)
Alkaline Phosphatase: 98 U/L (ref 38–126)
Anion gap: 8 (ref 5–15)
BUN: 32 mg/dL — ABNORMAL HIGH (ref 6–20)
CO2: 22 mmol/L (ref 22–32)
Calcium: 8.2 mg/dL — ABNORMAL LOW (ref 8.9–10.3)
Chloride: 107 mmol/L (ref 98–111)
Creatinine, Ser: 1.34 mg/dL — ABNORMAL HIGH (ref 0.61–1.24)
GFR, Estimated: >60 mL/min (ref >60)
Glucose, Bld: 112 mg/dL — ABNORMAL HIGH (ref 70–99)
Potassium: 4 mmol/L (ref 3.5–5.1)
Sodium: 137 mmol/L (ref 135–145)
Total Bilirubin: 0.4 mg/dL (ref 0.3–1.2)
Total Protein: 5.2 g/dL — ABNORMAL LOW (ref 6.5–8.1)

## 2020-11-12 LAB — CBC
HCT: 26.7 % — ABNORMAL LOW (ref 39.0–52.0)
Hemoglobin: 8.5 g/dL — ABNORMAL LOW (ref 13.0–17.0)
MCH: 26.5 pg (ref 26.0–34.0)
MCHC: 31.8 g/dL (ref 30.0–36.0)
MCV: 83.2 fL (ref 80.0–100.0)
Platelets: 548 10*3/uL — ABNORMAL HIGH (ref 150–400)
RBC: 3.21 MIL/uL — ABNORMAL LOW (ref 4.22–5.81)
RDW: 19.3 % — ABNORMAL HIGH (ref 11.5–15.5)
WBC: 8.9 10*3/uL (ref 4.0–10.5)
nRBC: 0 % (ref 0.0–0.2)

## 2020-11-12 LAB — ECHOCARDIOGRAM COMPLETE
AR max vel: 2.59 cm2
AV Area VTI: 2.62 cm2
AV Area mean vel: 2.41 cm2
AV Mean grad: 2.5 mmHg
AV Peak grad: 4.7 mmHg
Ao pk vel: 1.09 m/s
Area-P 1/2: 4.89 cm2
Height: 71 in
S' Lateral: 2.9 cm
Weight: 2768 oz

## 2020-11-12 LAB — URINE CULTURE: Culture: NO GROWTH

## 2020-11-12 NOTE — Consult Note (Signed)
Cardiology Consultation:   Patient ID: [name] MRN: [id number]; DOB: [birth year]  Admit date: 11/10/2020 Date of Consult: 11/12/2020  Primary Care Provider: [facility], [facility] Cardiologist: [facility], Dr. Mariah Milling rounding [facility] Electrophysiologist:  None    Patient Profile:   William Vaughan is a 35 y.o. male with a hx of current GIB with need for PRBC, Crohn's dz, PTSD, anxiety, previous COVID-19 infection 11/2019, and who is being seen today for the evaluation of chest pain and elevated troponin in the setting of GI bleed at the request of Dr. Myriam Forehand.  History of Present Illness:   William Vaughan is a 35 year old male with no previously known cardiac history and current GI bleed.  Other PMH includes Crohn's, PTSD, anxiety/depression.  He reports that he used to be fairly active in the past.  However, he has recently undergone left ankle surgery (due to fall) and with his ongoing Crohn's disease, he has been less active than in the past.  He does report trying to make healthy changes in his lifestyle. He has a history of Crohn's disease and reports diarrhea and slow bleeding over the last few weeks.  In this setting, he has felt poorly.  He presented to the [facility] ED 1/26 after chest pain that started around 3 AM and while having a bowel movement.  Chest pain was described as a pressure that occurred with his bowel movements though he is unclear on the timing and duration of this chest discomfort.  He reports that the symptom was concerning to him, as he thought he was may be having a stroke or heart attack.  He also reported associated palpitations and abnormal taste in his mouth.  He states that he again tried to scramble to get his phone on the bed but felt very dizzy/lightheaded passed out/loss consciousness for an unknown amount of time.  When he woke, he reports he was in the company of EMS.  He was taken to [facility] ED 11/10/2020.  In the ED, initial BP 97/40 with HR  105 bpm.  EKG significant for sinus tachycardia without acute ST/T changes.  Labs significant for leukocytosis with WBC 29.2, severe anemia with hemoglobin 5.3 and hematocrit 17.3, platelets 836, hypokalemia with potassium 3.1, hyperglycemia with glucose 286, AKI with creatinine 1.55 and BUN 24, hypocalcemia with calcium 7.8, hypoalbuminemia with albumin 2.2, bilirubin 0.2, lactic acid elevated at 7.6, D-dimer elevated at 785.74, initial high-sensitivity troponin 281 and peaked at 429.0 now downtrending.  CTA without pulmonary embolus, aortic aneurysm or dissection.  Scattered areas of atelectatic change.  No edema or effusions.  CT abdomen with mild diffuse pancolitis, incidental note of small bowel-small bowel intussusception, subtle subchondral cystic change versus tiny erosions of the bilateral SI joints as outlined in imaging studies.  Chest x-ray with low lung volumes and mild scoliosis thoracic spine.  No acute cardiopulmonary disease.  Echo as below with normal EF.    He denies any recurrence of his chest pain since his earlier episode during his bowel movement/GI bleed before presentation on 1/26.    He is now s/p 4 units PRBC with improved vitals.     Most recent hemoglobin 1/27 with Hgb 9.0.   No labs yet for today.  Past Medical History:  Diagnosis Date  . Crohn's disease (HCC) 12/2008  . PTSD (post-traumatic stress disorder)   . Seizures (HCC)   . Traumatic brain injury [facility])     History reviewed. No pertinent surgical history.  Home Medications:  Prior to Admission medications   Medication Sig Start Date End Date Taking? Authorizing Provider  acetaminophen (TYLENOL) 500 MG tablet Take 1,500 mg by mouth every 6 (six) hours as needed for mild pain (swelling).   Yes [provider]  cyanocobalamin 1000 MCG tablet Take 1,000 mcg by mouth daily.   Yes [provider]  ferrous sulfate 325 (65 FE) MG EC tablet Take 325 mg by mouth daily with breakfast.   Yes  [provider]  folic acid (FOLVITE) 1 MG tablet Take 1 mg by mouth daily.   Yes [provider]  nortriptyline (PAMELOR) 50 MG capsule Take 50 mg by mouth at bedtime.   Yes [provider]  predniSONE (DELTASONE) 10 MG tablet Take 30 mg by mouth daily. 09/10/20  Yes [provider]  topiramate (TOPAMAX) 15 MG capsule Take 60 mg by mouth at bedtime.   Yes [provider]  venlafaxine (EFFEXOR) 50 MG tablet Take 50 mg by mouth 2 (two) times daily with a meal.   Yes [provider]  dicyclomine (BENTYL) 20 MG tablet Take 1 tablet (20 mg total) by mouth 3 (three) times daily as needed (abdominal pain). 12/11/18   Phineas SemenGoodman, Graydon, MD    Inpatient Medications: Scheduled Meds: . ferrous sulfate  325 mg Oral BID WC  . folic acid  1 mg Oral Daily  . methylPREDNISolone (SOLU-MEDROL) injection  60 mg Intravenous Daily  . nortriptyline  50 mg Oral QHS  . QUEtiapine  100 mg Oral QHS  . topiramate  50 mg Oral Daily  . venlafaxine  50 mg Oral BID WC   Continuous Infusions: . ceFEPime (MAXIPIME) IV Stopped (11/12/20 [id number])  . metronidazole Stopped (11/12/20 0928)   PRN Meds: acetaminophen **OR** acetaminophen, hydrOXYzine, LORazepam, morphine injection, ondansetron **OR** ondansetron (ZOFRAN) IV, oxyCODONE-acetaminophen  Allergies:    Allergies  Allergen Reactions  . Ambien [Zolpidem Tartrate]     Violent flashbacks  . Food [Peanut-Containing Drug Products] Other (See Comments)    Peanuts Makes crohn's worse/act up  . Diltiazem Rash    Social History:   Social History   Socioeconomic History  . Marital status: Single    Spouse name: Not on file  . Number of children: Not on file  . Years of education: Not on file  . Highest education level: Not on file  Occupational History  . Not on file  Tobacco Use  . Smoking status: Current Every Day Smoker    Types: Cigars  . Smokeless tobacco: Never Used  Substance and Sexual Activity   . Alcohol use: Yes  . Drug use: Not Currently  . Sexual activity: Not on file  Other Topics Concern  . Not on file  Social History Narrative  . Not on file   Social Determinants of Health   Financial Resource Strain: Not on file  Food Insecurity: Not on file  Transportation Needs: Not on file  Physical Activity: Not on file  Stress: Not on file  Social Connections: Not on file  Intimate Partner Violence: Not on file    Family History:   History reviewed. No pertinent family history.  No reported family history of heart disease.  ROS:  Please see the history of present illness.  Review of Systems  Constitutional: Positive for malaise/fatigue.  Cardiovascular: Positive for chest pain.  Gastrointestinal: Positive for abdominal pain, blood in stool, diarrhea, heartburn, melena and nausea.  Musculoskeletal: Positive for falls.  Neurological: Positive for dizziness, loss of  consciousness and weakness.  All other systems reviewed and are negative.   All other ROS reviewed and negative.     Physical Exam/Data:   Vitals:   11/12/20 1300 11/12/20 1330 11/12/20 1400 11/12/20 1620  BP: 128/76 126/83 126/82 134/77  Pulse: 83 79 82 86  Resp: 17 14 13 18   Temp:    98.7 F (37.1 C)  TempSrc:    Oral  SpO2: 98% 98% 99% 100%  Weight:      Height:        Intake/Output Summary (Last 24 hours) at 11/12/2020 1637 Last data filed at 11/12/2020 0111 Gross per 24 hour  Intake 1930 ml  Output --  Net 1930 ml   Last 3 Weights 11/10/2020 11/10/2020 09/09/2020  Weight (lbs) 173 lb 199 lb 1.2 oz 199 lb  Weight (kg) 78.472 kg 90.3 kg 90.266 kg  Some encounter information is confidential and restricted. Go to Review Flowsheets activity to see all data.     Body mass index is 24.13 kg/m.  General: Very pale appearing male, NAD HEENT: normal Lymph: no adenopathy Neck: no JVD Endocrine:  No thryomegaly Vascular: No carotid bruits; FA pulses 2+ bilaterally without bruits  Cardiac:   normal S1, S2; borderline tachycardic rate but regular rhythm; no murmur  Lungs:  clear to auscultation bilaterally, no wheezing, rhonchi or rales  Abd: soft, nontender, no hepatomegaly  Ext: no edema, other than that associated with left ankle repair Musculoskeletal:  No deformities, BUE and BLE strength normal and equal Skin: warm and dry -left ankle repair noted Neuro:  CNs 2-12 intact, no focal abnormalities noted Psych:  Normal affect   EKG:  The EKG was personally reviewed and demonstrates: Sinus tachycardia without acute ST/T changes Telemetry:  Telemetry was personally reviewed and demonstrates: NSR to sinus tachycardia  Relevant CV Studies: Echo 11/12/20 1. Left ventricular ejection fraction, by estimation, is 60 to 65%. The  left ventricle has normal function. The left ventricle has no regional  wall motion abnormalities. Left ventricular diastolic parameters were  normal.  2. Right ventricular systolic function is normal. The right ventricular  size is normal.   Laboratory Data:  High Sensitivity Troponin:   Recent Labs  Lab 11/10/20 1040 11/10/20 1320 11/10/20 1904 11/11/20 0423  TROPONINIHS 281* 429* 193* 54*     Chemistry Recent Labs  Lab 11/10/20 0926 11/11/20 0423  NA 135 137  K 3.4* 4.4  CL 106 108  CO2 16* 23  GLUCOSE 286* 80  BUN 24* 25*  CREATININE 1.55* 1.40*  CALCIUM 7.8* 7.7*  GFRNONAA 59* >60  ANIONGAP 13 6    Recent Labs  Lab 11/10/20 0926  PROT 4.8*  ALBUMIN 2.2*  AST 33  ALT 41  ALKPHOS 109  BILITOT 0.2*   Hematology Recent Labs  Lab 11/10/20 0926 11/10/20 1904 11/11/20 0423 11/11/20 1409  WBC 29.2*  --  11.8* 12.9*  RBC 2.06*  --  2.72* 3.35*  HGB 5.3* 6.8* 7.0* 9.0*  HCT 17.3* 21.6* 21.9* 27.0*  MCV 84.0  --  80.5 80.6  MCH 25.7*  --  25.7* 26.9  MCHC 30.6  --  32.0 33.3  RDW 16.0*  --  20.0* 19.1*  PLT 836*  --  440* 496*   BNPNo results for input(s): BNP, PROBNP in the last 168 hours.  DDimer No results for  input(s): DDIMER in the last 168 hours.   Radiology/Studies:  CT Angio Chest PE W and/or Wo Contrast  Result Date: 11/10/2020  CLINICAL DATA:  Chest pain. EXAM: CT ANGIOGRAPHY CHEST WITH CONTRAST TECHNIQUE: Multidetector CT imaging of the chest was performed using the standard protocol during bolus administration of intravenous contrast. Multiplanar CT image reconstructions and MIPs were obtained to evaluate the vascular anatomy. CONTRAST:  85mL OMNIPAQUE IOHEXOL 350 MG/ML SOLN COMPARISON:  Chest radiograph November 10, 2020 FINDINGS: Cardiovascular: There is no demonstrable pulmonary embolus. There is no thoracic aortic aneurysm or dissection. Visualized great vessels appear unremarkable. No evident pericardial effusion or pericardial thickening. Mediastinum/Nodes: Visualized thyroid appears unremarkable. No evident thoracic adenopathy. No esophageal lesions are appreciable. Lungs/Pleura: There are scattered areas of mild atelectatic change. No edema or airspace opacity. No appreciable pleural effusions. No pneumothorax. Trachea and major bronchial structures appear patent. Upper Abdomen: Visualized upper abdominal structures appear unremarkable. Musculoskeletal: There is mild thoracic dextroscoliosis. No blastic or lytic bone lesions are evident. There are no chest wall lesions. Review of the MIP images confirms the above findings. IMPRESSION: 1. No evident pulmonary embolus. No thoracic aortic aneurysm or dissection. 2. Scattered areas of mild atelectatic change. No edema or airspace opacity. No pleural effusions. 3.  No evident adenopathy. Electronically Signed   By: Bretta Bang III M.D.   On: 11/10/2020 11:47   CT ABDOMEN PELVIS W CONTRAST  Result Date: 11/10/2020 CLINICAL DATA:  Diarrhea.  History of Crohn's disease EXAM: CT ABDOMEN AND PELVIS WITH CONTRAST TECHNIQUE: Multidetector CT imaging of the abdomen and pelvis was performed using the standard protocol following bolus administration of  intravenous contrast. CONTRAST:  13mL OMNIPAQUE IOHEXOL 350 MG/ML SOLN COMPARISON:  None. FINDINGS: Lower chest: Included lung bases are clear.  Heart size is normal. Hepatobiliary: No focal liver abnormality is seen. No gallstones, gallbladder wall thickening, or biliary dilatation. Pancreas: Unremarkable. No pancreatic ductal dilatation or surrounding inflammatory changes. Spleen: There are a few punctate calcifications within the spleen suggesting sequela of chronic granulomatous disease. Spleen otherwise normal in size and appearance. Adrenals/Urinary Tract: Unremarkable adrenal glands. Kidneys enhance symmetrically without focal lesion, stone, or hydronephrosis. Ureters are nondilated. Urinary bladder appears unremarkable. Stomach/Bowel: Findings of mild near pancolonic colitis with mild diffuse colonic wall thickening, loss of haustration, and subtle associated fat stranding. Findings are most evident within the proximal transverse colon and mid descending colon. There are a few filling defects within the region of the hepatic flexure and proximal transverse colon which are felt to most likely represents stool. Underlying colonic mass would be difficult to exclude. Submucosal fat deposition within the terminal ileum. Incidental note of a small bowel-small bowel intussusception in the left hemiabdomen (series 5, image 54). No definite lead point mass lesion. No bowel obstruction. Normal appendix within the right lower quadrant (series 4, image 65). Stomach within normal limits. Vascular/Lymphatic: No significant vascular findings are present. No enlarged abdominal or pelvic lymph nodes. Reproductive: Prostate is unremarkable. Other: No free fluid. No abdominopelvic fluid collection. No pneumoperitoneum. No abdominal wall hernia. Musculoskeletal: Subtle subchondral cystic change versus tiny erosions of the bilateral SI joints, potentially reflecting sequela of sacroiliitis. No acute or significant osseous  findings. IMPRESSION: 1. Findings of mild diffuse pancolitis, most evident within the proximal transverse colon and mid descending colon. Findings are in keeping with patient's reported diagnosis of Crohn's disease. There are a few filling defects within the region of the hepatic flexure and proximal transverse colon which are felt to most likely represent stool. Underlying colonic mass would be difficult to exclude. Correlation with colonoscopy is recommended following resolution of patient's acute symptoms, if not recently  performed. 2. Incidental note of a small bowel-small bowel intussusception in the left hemiabdomen. No definite lead point mass lesion. No bowel obstruction. Consider short-term follow-up with CT enterography. 3. Subtle subchondral cystic change versus tiny erosions of the bilateral SI joints, potentially reflecting sequela of sacroiliitis. Electronically Signed   By: Duanne Guess D.O.   On: 11/10/2020 12:02   DG Chest Port 1 View  Result Date: 11/10/2020 CLINICAL DATA:  Diarrhea.  Multiple falls.  Right-sided rib pain. EXAM: PORTABLE CHEST 1 VIEW COMPARISON:  Chest x-ray 07/20/2014. FINDINGS: Mediastinum and hilar structures normal. Heart size normal. Low lung volumes. No focal infiltrate. No pleural effusion or pneumothorax. Mild scoliosis thoracic spine. IMPRESSION: No acute cardiopulmonary disease. Electronically Signed   By: Maisie Fus  Register   On: 11/10/2020 10:25   ECHOCARDIOGRAM COMPLETE  Result Date: 11/12/2020    ECHOCARDIOGRAM REPORT   Patient Name:   LESHAUN BIEBEL Date of Exam: 11/12/2020 Medical Rec #:  [id number]     Height:       71.0 in Accession #:    [id number]    Weight:       173.0 lb Date of Birth:  1986/01/07     BSA:          1.983 m Patient Age:    34 years      BP:           128/71 mmHg Patient Gender: M             HR:           98 bpm. Exam Location:  [facility] Procedure: 2D Echo, Cardiac Doppler and Color Doppler Indications:     Chest Pain R07.9  History:          Patient has no prior history of Echocardiogram examinations. No                  cardiac history listed in chart.  Sonographer:     Cristela Blue RDCS (AE) Referring Phys:  [id number] [facility] AMIN Diagnosing Phys: Julien Nordmann MD IMPRESSIONS  1. Left ventricular ejection fraction, by estimation, is 60 to 65%. The left ventricle has normal function. The left ventricle has no regional wall motion abnormalities. Left ventricular diastolic parameters were normal.  2. Right ventricular systolic function is normal. The right ventricular size is normal. FINDINGS  Left Ventricle: Left ventricular ejection fraction, by estimation, is 60 to 65%. The left ventricle has normal function. The left ventricle has no regional wall motion abnormalities. The left ventricular internal cavity size was normal in size. There is  no left ventricular hypertrophy. Left ventricular diastolic parameters were normal. Right Ventricle: The right ventricular size is normal. No increase in right ventricular wall thickness. Right ventricular systolic function is normal. Left Atrium: Left atrial size was normal in size. Right Atrium: Right atrial size was normal in size. Pericardium: There is no evidence of pericardial effusion. Mitral Valve: The mitral valve is normal in structure. No evidence of mitral valve regurgitation. No evidence of mitral valve stenosis. Tricuspid Valve: The tricuspid valve is normal in structure. Tricuspid valve regurgitation is not demonstrated. No evidence of tricuspid stenosis. Aortic Valve: The aortic valve is normal in structure. Aortic valve regurgitation is not visualized. No aortic stenosis is present. Aortic valve mean gradient measures 2.5 mmHg. Aortic valve peak gradient measures 4.7 mmHg. Aortic valve area, by VTI measures 2.62 cm. Pulmonic Valve: The pulmonic valve was normal in structure. Pulmonic valve regurgitation is not visualized. No  evidence of pulmonic stenosis. Aorta: The aortic root is normal in size and  structure. Venous: The inferior vena cava is normal in size with greater than 50% respiratory variability, suggesting right atrial pressure of 3 mmHg. IAS/Shunts: No atrial level shunt detected by color flow Doppler.  LEFT VENTRICLE PLAX 2D LVIDd:         4.23 cm  Diastology LVIDs:         2.90 cm  LV e' medial:    7.72 cm/s LV PW:         1.23 cm  LV E/e' medial:  10.8 LV IVS:        1.01 cm  LV e' lateral:   12.60 cm/s LVOT diam:     2.00 cm  LV E/e' lateral: 6.6 LV SV:         46 LV SV Index:   23 LVOT Area:     3.14 cm  RIGHT VENTRICLE RV Basal diam:  2.71 cm LEFT ATRIUM             Index       RIGHT ATRIUM           Index LA diam:        3.30 cm 1.66 cm/m  RA Area:     13.90 cm LA Vol (A2C):   71.8 ml 36.22 ml/m RA Volume:   30.00 ml  15.13 ml/m LA Vol (A4C):   38.1 ml 19.22 ml/m LA Biplane Vol: 52.9 ml 26.68 ml/m  AORTIC VALVE                   PULMONIC VALVE AV Area (Vmax):    2.59 cm    PV Vmax:        0.85 m/s AV Area (Vmean):   2.41 cm    PV Peak grad:   2.9 mmHg AV Area (VTI):     2.62 cm    RVOT Peak grad: 4 mmHg AV Vmax:           108.55 cm/s AV Vmean:          75.900 cm/s AV VTI:            0.175 m AV Peak Grad:      4.7 mmHg AV Mean Grad:      2.5 mmHg LVOT Vmax:         89.40 cm/s LVOT Vmean:        58.300 cm/s LVOT VTI:          0.146 m LVOT/AV VTI ratio: 0.83  AORTA Ao Root diam: 3.20 cm MITRAL VALVE               TRICUSPID VALVE MV Area (PHT): 4.89 cm    TR Peak grad:   8.9 mmHg MV Decel Time: 155 msec    TR Vmax:        149.00 cm/s MV E velocity: 83.60 cm/s MV A velocity: 55.70 cm/s  SHUNTS MV E/A ratio:  1.50        Systemic VTI:  0.15 m                            Systemic Diam: 2.00 cm Julien Nordmann MD Electronically signed by Julien Nordmann MD Signature Date/Time: 11/12/2020/11:13:23 AM    Final      Assessment and Plan:   1. NSTEMI --Reports chest pain in the setting of known Crohn's disease with recent  GI bleed.  Denies any recurrent chest pain since that time.  At  presentation, high-sensitivity troponin elevated with peak of 429.0, now downtrending.  EKG without acute ST/T changes.  Echo with normal EF and no regional wall motion abnormalities.  Not consistent with ACS.  Suspect supply demand ischemia in the setting of GI bleed with severe anemia at presentation, as well as likely prerenal AKI given blood loss and hypotension with tachycardic rate.  S/p 4 units PRBC.  No further ischemic work-up recommended at this time.  Recommend treatment of underlying GI bleed.  2. GIB, severe anemia --Per IM. --Continue to administer PRBC as needed. --Daily CBC.   --No labs resulted yet for today.  Recommend daily labs, given his severe GI bleed. Follow up with GI at the [facility]  Remainder per IM  Risk Assessment/Risk Scores:     TIMI Risk Score for Unstable Angina or Non-ST Elevation MI:   The patient's TIMI risk score is 1, which indicates a 5% risk of all cause mortality, new or recurrent myocardial infarction or need for urgent revascularization in the next 14 days.          For questions or updates, please contact [facility] Please consult [email or web address] for contact info under    Signed, Lennon Alstrom, PA-C  11/12/2020 4:37 PM

## 2020-11-12 NOTE — Progress Notes (Addendum)
Progress Note    DAVEION ROBAR  GUY:[id number] DOB: 09-Nov-1985  DOA: 11/10/2020 PCP: [facility]      Brief Narrative:    Medical records reviewed and are as summarized below:  LOGYN KENDRICK is a 35 y.o. male       Assessment/Plan:   Principal Problem:   GI bleed Active Problems:   Adjustment disorder with mixed anxiety and depressed mood   PTSD (post-traumatic stress disorder)   Crohn's disease (HCC)   Severe anemia   AKI (acute kidney injury) (HCC)   GI bleeding   Body mass index is 24.13 kg/m.    Acute flare of Crohn's disease/Crohn's pancolitis: Continue IV steroids and empiric IV antibiotics.  Severe acute blood loss anemia: Improved s/p transfusion with 4 units of packed red blood cells.  Monitor H&H.  AKI: Creatinine is slowly improving.  Monitor BMP.  Chest pain and elevated troponins: Troponin peaked at 429.  Likely due to demand ischemia in the setting of severe anemia.  2D echo showed EF estimated at 60 to 65%. Consulted cardiologist to assist with management.  Incidental finding small bowel-small bowel intussusception on CT scan abdomen: Patient was evaluated by the surgeon and no surgery was recommended.  PTSD, anxiety and depression: Continue psychotropics  Seizure disorder: Continue Topamax    Diet Order            Diet clear liquid Room service appropriate? Yes; Fluid consistency: Thin  Diet effective now                    Consultants:  Gastroenterologist  Procedures:  None    Medications:   . ferrous sulfate  325 mg Oral BID WC  . folic acid  1 mg Oral Daily  . methylPREDNISolone (SOLU-MEDROL) injection  60 mg Intravenous Daily  . nortriptyline  50 mg Oral QHS  . QUEtiapine  100 mg Oral QHS  . topiramate  50 mg Oral Daily  . venlafaxine  50 mg Oral BID WC   Continuous Infusions: . ceFEPime (MAXIPIME) IV Stopped (11/12/20 5638)  . metronidazole Stopped (11/12/20 7564)     Anti-infectives  (From admission, onward)   Start     Dose/Rate Route Frequency Ordered Stop   11/10/20 2000  ceFEPIme (MAXIPIME) 2 g in sodium chloride 0.9 % 100 mL IVPB        2 g 200 mL/hr over 30 Minutes Intravenous Every 8 hours 11/10/20 1621     11/10/20 1600  metroNIDAZOLE (FLAGYL) IVPB 500 mg        500 mg 100 mL/hr over 60 Minutes Intravenous Every 8 hours 11/10/20 1458     11/10/20 1030  ceFEPIme (MAXIPIME) 2 g in sodium chloride 0.9 % 100 mL IVPB        2 g 200 mL/hr over 30 Minutes Intravenous  Once 11/10/20 1017 11/10/20 1149   11/10/20 1030  metroNIDAZOLE (FLAGYL) IVPB 500 mg        500 mg 100 mL/hr over 60 Minutes Intravenous  Once 11/10/20 1017 11/10/20 1233             Family Communication/Anticipated D/C date and plan/Code Status   DVT prophylaxis: Place TED hose Start: 11/10/20 1343     Code Status: Full Code  Family Communication: None Disposition Plan:    Status is: Inpatient  Remains inpatient appropriate because:IV treatments appropriate due to intensity of illness or inability to take PO   Dispo: The patient  is from: Home              Anticipated d/c is to: Home              Anticipated d/c date is: 2 days              Patient currently is not medically stable to d/c.   Difficult to place patient No           Subjective:   C/o abdominal pain.  He does not recall whether he had diarrhea today or not.  Objective:    Vitals:   11/12/20 0930 11/12/20 1000 11/12/20 1030 11/12/20 1100  BP: 125/66 128/71 121/71 127/82  Pulse: 98 98 86 87  Resp: 18 14 14 15   Temp:      TempSrc:      SpO2: 99% 100% 100% 92%  Weight:      Height:       No data found.   Intake/Output Summary (Last 24 hours) at 11/12/2020 1150 Last data filed at 11/12/2020 0111 Gross per 24 hour  Intake 2670 ml  Output --  Net 2670 ml   Filed Weights   11/10/20 0918 11/10/20 0925  Weight: 90.3 kg 78.5 kg    Exam:  GEN: NAD SKIN: No rash EYES: EOMI ENT: MMM.  Edentulous CV: RRR PULM: CTA B ABD: soft, ND, lower abdominal tenderness, no abdominal tenderness or guarding, +BS CNS: AAO x 3, non focal EXT: No edema or tenderness   Data Reviewed:   I have personally reviewed following labs and imaging studies:  Labs: Labs show the following:   Basic Metabolic Panel: Recent Labs  Lab 11/10/20 0926 11/11/20 0423  NA 135 137  K 3.4* 4.4  CL 106 108  CO2 16* 23  GLUCOSE 286* 80  BUN 24* 25*  CREATININE 1.55* 1.40*  CALCIUM 7.8* 7.7*   GFR Estimated Creatinine Clearance: 78.4 mL/min (A) (by C-G formula based on SCr of 1.4 mg/dL (H)). Liver Function Tests: Recent Labs  Lab 11/10/20 0926  AST 33  ALT 41  ALKPHOS 109  BILITOT 0.2*  PROT 4.8*  ALBUMIN 2.2*   No results for input(s): LIPASE, AMYLASE in the last 168 hours. No results for input(s): AMMONIA in the last 168 hours. Coagulation profile Recent Labs  Lab 11/10/20 1040  INR 1.2    CBC: Recent Labs  Lab 11/10/20 0926 11/10/20 1904 11/11/20 0423 11/11/20 1409  WBC 29.2*  --  11.8* 12.9*  NEUTROABS 22.0*  --   --   --   HGB 5.3* 6.8* 7.0* 9.0*  HCT 17.3* 21.6* 21.9* 27.0*  MCV 84.0  --  80.5 80.6  PLT 836*  --  440* 496*   Cardiac Enzymes: Recent Labs  Lab 11/10/20 0926  CKTOTAL 60   BNP (last 3 results) No results for input(s): PROBNP in the last 8760 hours. CBG: Recent Labs  Lab 11/10/20 0937  GLUCAP 260*   D-Dimer: No results for input(s): DDIMER in the last 72 hours. Hgb A1c: No results for input(s): HGBA1C in the last 72 hours. Lipid Profile: No results for input(s): CHOL, HDL, LDLCALC, TRIG, CHOLHDL, LDLDIRECT in the last 72 hours. Thyroid function studies: Recent Labs    11/10/20 1049  TSH 2.301   Anemia work up: No results for input(s): VITAMINB12, FOLATE, FERRITIN, TIBC, IRON, RETICCTPCT in the last 72 hours. Sepsis Labs: Recent Labs  Lab 11/10/20 0926 11/10/20 1320 11/11/20 0423 11/11/20 1409  WBC 29.2*  --  11.8* 12.9*   LATICACIDVEN 7.6* 1.6  --   --     Microbiology Recent Results (from the past 240 hour(s))  Blood Culture (routine x 2)     Status: None (Preliminary result)   Collection Time: 11/10/20  9:36 AM   Specimen: BLOOD  Result Value Ref Range Status   Specimen Description BLOOD LEFT UPPER ARM  Final   Special Requests   Final    BOTTLES DRAWN AEROBIC AND ANAEROBIC Blood Culture results may not be optimal due to an excessive volume of blood received in culture bottles   Culture   Final    NO GROWTH 2 DAYS Performed at [facility], [address] [place], [place] [address]    Report Status PENDING  Incomplete  SARS Coronavirus 2 by RT PCR (hospital order, performed in [facility] hospital lab) Nasopharyngeal Nasopharyngeal Swab     Status: None   Collection Time: 11/10/20 10:40 AM   Specimen: Nasopharyngeal Swab  Result Value Ref Range Status   SARS Coronavirus 2 NEGATIVE NEGATIVE Final    Comment: (NOTE) SARS-CoV-2 target nucleic acids are NOT DETECTED.  The SARS-CoV-2 RNA is generally detectable in upper and lower respiratory specimens during the acute phase of infection. The lowest concentration of SARS-CoV-2 viral copies this assay can detect is 250 copies / mL. A negative result does not preclude SARS-CoV-2 infection and should not be used as the sole basis for treatment or other patient management decisions.  A negative result may occur with improper specimen collection / handling, submission of specimen other than nasopharyngeal swab, presence of viral mutation(s) within the areas targeted by this assay, and inadequate number of viral copies (<250 copies / mL). A negative result must be combined with clinical observations, patient history, and epidemiological information.  Fact Sheet for Patients:   BoilerBrush.com.cy  Fact Sheet for Healthcare Providers: https://pope.com/  This test is not yet approved or  cleared  by the [place] FDA and has been authorized for detection and/or diagnosis of SARS-CoV-2 by FDA under an Emergency Use Authorization (EUA).  This EUA will remain in effect (meaning this test can be used) for the duration of the COVID-19 declaration under Section 564(b)(1) of the Act, 21 U.S.C. section 360bbb-3(b)(1), unless the authorization is terminated or revoked sooner.  Performed at [facility], [address] [place], [place] [address]   Blood Culture (routine x 2)     Status: None (Preliminary result)   Collection Time: 11/10/20 10:40 AM   Specimen: BLOOD  Result Value Ref Range Status   Specimen Description BLOOD RIGHT ANTECUBITAL  Final   Special Requests   Final    BOTTLES DRAWN AEROBIC AND ANAEROBIC Blood Culture results may not be optimal due to an inadequate volume of blood received in culture bottles   Culture   Final    NO GROWTH 2 DAYS Performed at [facility], [address] [place], [place] [address]    Report Status PENDING  Incomplete  Urine culture     Status: None   Collection Time: 11/10/20  9:00 PM   Specimen: Urine, Random  Result Value Ref Range Status   Specimen Description   Final    URINE, RANDOM Performed at [facility], [address], [place], [place] [address]    Special Requests   Final    NONE Performed at [facility], [address], [place], [place] [address]    Culture   Final  NO GROWTH Performed at [facility], [address] [place], [place] [address]    Report Status 11/12/2020 FINAL  Final  Gastrointestinal Panel by PCR , Stool     Status: None   Collection Time: 11/11/20  7:25 AM   Specimen: Stool  Result Value Ref Range Status   Campylobacter species NOT DETECTED NOT DETECTED Final   Plesimonas shigelloides NOT DETECTED NOT DETECTED Final   Salmonella species NOT DETECTED NOT DETECTED Final   Yersinia enterocolitica NOT DETECTED NOT DETECTED Final   Vibrio species  NOT DETECTED NOT DETECTED Final   Vibrio cholerae NOT DETECTED NOT DETECTED Final   Enteroaggregative E coli (EAEC) NOT DETECTED NOT DETECTED Final   Enteropathogenic E coli (EPEC) NOT DETECTED NOT DETECTED Final   Enterotoxigenic E coli (ETEC) NOT DETECTED NOT DETECTED Final   Shiga like toxin producing E coli (STEC) NOT DETECTED NOT DETECTED Final   Shigella/Enteroinvasive E coli (EIEC) NOT DETECTED NOT DETECTED Final   Cryptosporidium NOT DETECTED NOT DETECTED Final   Cyclospora cayetanensis NOT DETECTED NOT DETECTED Final   Entamoeba histolytica NOT DETECTED NOT DETECTED Final   Giardia lamblia NOT DETECTED NOT DETECTED Final   Adenovirus F40/41 NOT DETECTED NOT DETECTED Final   Astrovirus NOT DETECTED NOT DETECTED Final   Norovirus GI/GII NOT DETECTED NOT DETECTED Final   Rotavirus A NOT DETECTED NOT DETECTED Final   Sapovirus (I, II, IV, and V) NOT DETECTED NOT DETECTED Final    Comment: Performed at [facility], [address] [place], [place] [address]  C Difficile Quick Screen w PCR reflex     Status: None   Collection Time: 11/11/20  7:25 AM   Specimen: Stool  Result Value Ref Range Status   C Diff antigen NEGATIVE NEGATIVE Final   C Diff toxin NEGATIVE NEGATIVE Final   C Diff interpretation No C. difficile detected.  Final    Comment: Performed at [facility], [address] [place], [place] [address]    Procedures and diagnostic studies:  ECHOCARDIOGRAM COMPLETE  Result Date: 11/12/2020    ECHOCARDIOGRAM REPORT   Patient Name:   Charlaine DaltonBRIAN V Ground Date of Exam: 11/12/2020 Medical Rec #:  [id number]     Height:       71.0 in Accession #:    [id number]    Weight:       173.0 lb Date of Birth:  Feb 08, 1986     BSA:          1.983 m Patient Age:    34 years      BP:           128/71 mmHg Patient Gender: M             HR:           98 bpm. Exam Location:  [facility] Procedure: 2D Echo, Cardiac Doppler and Color Doppler Indications:     Chest Pain R07.9  History:          Patient has no prior history of Echocardiogram examinations. No                  cardiac history listed in chart.  Sonographer:     Cristela BlueJerry Hege RDCS (AE) Referring Phys:  [id number] [facility] Diagnosing Phys: Julien Nordmannimothy Gollan MD IMPRESSIONS  1. Left ventricular ejection fraction, by estimation, is 60 to 65%. The left ventricle has normal function. The left ventricle has no regional wall motion abnormalities. Left ventricular diastolic parameters were  normal.  2. Right ventricular systolic function is normal. The right ventricular size is normal. FINDINGS  Left Ventricle: Left ventricular ejection fraction, by estimation, is 60 to 65%. The left ventricle has normal function. The left ventricle has no regional wall motion abnormalities. The left ventricular internal cavity size was normal in size. There is  no left ventricular hypertrophy. Left ventricular diastolic parameters were normal. Right Ventricle: The right ventricular size is normal. No increase in right ventricular wall thickness. Right ventricular systolic function is normal. Left Atrium: Left atrial size was normal in size. Right Atrium: Right atrial size was normal in size. Pericardium: There is no evidence of pericardial effusion. Mitral Valve: The mitral valve is normal in structure. No evidence of mitral valve regurgitation. No evidence of mitral valve stenosis. Tricuspid Valve: The tricuspid valve is normal in structure. Tricuspid valve regurgitation is not demonstrated. No evidence of tricuspid stenosis. Aortic Valve: The aortic valve is normal in structure. Aortic valve regurgitation is not visualized. No aortic stenosis is present. Aortic valve mean gradient measures 2.5 mmHg. Aortic valve peak gradient measures 4.7 mmHg. Aortic valve area, by VTI measures 2.62 cm. Pulmonic Valve: The pulmonic valve was normal in structure. Pulmonic valve regurgitation is not visualized. No evidence of pulmonic stenosis. Aorta: The aortic root is normal in size and  structure. Venous: The inferior vena cava is normal in size with greater than 50% respiratory variability, suggesting right atrial pressure of 3 mmHg. IAS/Shunts: No atrial level shunt detected by color flow Doppler.  LEFT VENTRICLE PLAX 2D LVIDd:         4.23 cm  Diastology LVIDs:         2.90 cm  LV e' medial:    7.72 cm/s LV PW:         1.23 cm  LV E/e' medial:  10.8 LV IVS:        1.01 cm  LV e' lateral:   12.60 cm/s LVOT diam:     2.00 cm  LV E/e' lateral: 6.6 LV SV:         46 LV SV Index:   23 LVOT Area:     3.14 cm  RIGHT VENTRICLE RV Basal diam:  2.71 cm LEFT ATRIUM             Index       RIGHT ATRIUM           Index LA diam:        3.30 cm 1.66 cm/m  RA Area:     13.90 cm LA Vol (A2C):   71.8 ml 36.22 ml/m RA Volume:   30.00 ml  15.13 ml/m LA Vol (A4C):   38.1 ml 19.22 ml/m LA Biplane Vol: 52.9 ml 26.68 ml/m  AORTIC VALVE                   PULMONIC VALVE AV Area (Vmax):    2.59 cm    PV Vmax:        0.85 m/s AV Area (Vmean):   2.41 cm    PV Peak grad:   2.9 mmHg AV Area (VTI):     2.62 cm    RVOT Peak grad: 4 mmHg AV Vmax:           108.55 cm/s AV Vmean:          75.900 cm/s AV VTI:            0.175 m AV Peak Grad:  4.7 mmHg AV Mean Grad:      2.5 mmHg LVOT Vmax:         89.40 cm/s LVOT Vmean:        58.300 cm/s LVOT VTI:          0.146 m LVOT/AV VTI ratio: 0.83  AORTA Ao Root diam: 3.20 cm MITRAL VALVE               TRICUSPID VALVE MV Area (PHT): 4.89 cm    TR Peak grad:   8.9 mmHg MV Decel Time: 155 msec    TR Vmax:        149.00 cm/s MV E velocity: 83.60 cm/s MV A velocity: 55.70 cm/s  SHUNTS MV E/A ratio:  1.50        Systemic VTI:  0.15 m                            Systemic Diam: 2.00 cm Julien Nordmann MD Electronically signed by Julien Nordmann MD Signature Date/Time: 11/12/2020/11:13:23 AM    Final                LOS: 1 day   Torsha Lemus  [facility]   Pager on [email or web address]. If 7PM-7AM, please contact night-coverage at [email or web address]     11/12/2020, 11:50  AM

## 2020-11-12 NOTE — ED Notes (Signed)
Lab called and Jon Gills states they will send someone as soon as possible.

## 2020-11-12 NOTE — ED Notes (Signed)
Per night RN waiting for lab to collect morning blood specimen

## 2020-11-12 NOTE — Plan of Care (Signed)
Continuing with plan of care. 

## 2020-11-12 NOTE — Progress Notes (Signed)
*  PRELIMINARY RESULTS* Echocardiogram 2D Echocardiogram has been performed.  Cristela Blue 11/12/2020, 10:49 AM

## 2020-11-12 NOTE — ED Notes (Signed)
Lab called again to ensure blood specimen collection

## 2020-11-12 NOTE — ED Notes (Signed)
Echo at bedside

## 2020-11-13 MED ORDER — ENSURE ENLIVE PO LIQD
237.0000 mL | Freq: Three times a day (TID) | ORAL | Status: DC
  Administered 2020-11-13 – 2020-11-14 (×3): 237 mL via ORAL

## 2020-11-13 MED ORDER — ADULT MULTIVITAMIN W/MINERALS CH
1.0000 | ORAL_TABLET | Freq: Every day | ORAL | Status: DC
  Administered 2020-11-13 – 2020-11-14 (×2): 1 via ORAL
  Filled 2020-11-13 (×2): qty 1

## 2020-11-13 NOTE — Plan of Care (Signed)
Continuing with plan of care. 

## 2020-11-13 NOTE — Progress Notes (Signed)
Initial Nutrition Assessment  DOCUMENTATION CODES:   Not applicable  INTERVENTION:    Ensure Enlive po TID, each supplement provides 350 kcal and 20 grams of protein  MVI daily   NUTRITION DIAGNOSIS:   Inadequate oral intake related to decreased appetite as evidenced by per patient/family report.  GOAL:   Patient will meet greater than or equal to 90% of their needs  MONITOR:   PO intake,Diet advancement,Supplement acceptance,Weight trends,I & O's,Labs  REASON FOR ASSESSMENT:   Malnutrition Screening Tool    ASSESSMENT:   Patient with PMH significant for Crohn's disease, PTSD, and anxiety/depression. Presents this admission with acute flare of Crohn's disease.  Patient reports decreased appetite over the last 3 weeks. States during this time he consumed two meals daily that consisted of L-cheese sandwiches and D- mostly chicken (in attempts to get more protein. Reports he cannot tolerate uncooked vegetable, nuts, ravioli, beef spaghetti, and salads. Patient reports he feels better with IV steroids. Tolerating clears with last two meal completions charted as 80-90%. Plan advancement of diet to regular per GI.  Patient understands that he needs to consume enough calorie to support protein intake. He has tried Ensure before and has shown success with consumption. RD to provide this admission.   Patient endorses a UBW of 195 lb and a recent wt loss of 30 lb in the last three weeks. Record indicate patient weighed 199 lb on 11/25 and show a stated weight of 173 lb this admission. Will need to obtain actual weight to assess for weight loss.   Suspect malnutrition but unable to diagnose without actual weight or NFPE.   UOP: 800 ml x 24 hrs   Medications: folic acid, solumedrol, Labs: CBG 80-112  Diet Order:   Diet Order            Diet clear liquid Room service appropriate? Yes; Fluid consistency: Thin  Diet effective now                 EDUCATION NEEDS:   Education  needs have been addressed  Skin:  Skin Assessment: Reviewed RN Assessment  Last BM:  PTA  Height:   Ht Readings from Last 1 Encounters:  11/10/20 5\' 11"  (1.803 m)    Weight:   Wt Readings from Last 1 Encounters:  11/10/20 78.5 kg    BMI:  Body mass index is 24.13 kg/m.  Estimated Nutritional Needs:   Kcal:  2400-2600 kcal  Protein:  120-135 grams  Fluid:  >/= 2 L/day  11/12/20 RD, LDN Clinical Nutrition Pager listed in AMION

## 2020-11-13 NOTE — Progress Notes (Signed)
GI Inpatient Follow-up Note  Subjective:  States he is doing better today. Last bowel movement was yesterday. States still with abdominal pain.  Scheduled Inpatient Medications:  . ferrous sulfate  325 mg Oral BID WC  . folic acid  1 mg Oral Daily  . methylPREDNISolone (SOLU-MEDROL) injection  60 mg Intravenous Daily  . nortriptyline  50 mg Oral QHS  . QUEtiapine  100 mg Oral QHS  . topiramate  50 mg Oral Daily  . venlafaxine  50 mg Oral BID WC    Continuous Inpatient Infusions:   . ceFEPime (MAXIPIME) IV 2 g (11/13/20 1010)  . metronidazole 500 mg (11/13/20 0827)    PRN Inpatient Medications:  acetaminophen **OR** acetaminophen, hydrOXYzine, LORazepam, morphine injection, ondansetron **OR** ondansetron (ZOFRAN) IV, oxyCODONE-acetaminophen  Review of Systems:  Review of Systems  Constitutional: Negative for chills and fever.  Respiratory: Negative for cough.   Cardiovascular: Negative for chest pain.  Gastrointestinal: Positive for abdominal pain. Negative for blood in stool, diarrhea, nausea and vomiting.  Musculoskeletal: Negative for joint pain.  Skin: Negative for rash.  Neurological: Negative for focal weakness.  All other systems reviewed and are negative.    Physical Examination: BP 118/68 (BP Location: Left Arm)   Pulse 83   Temp 98.5 F (36.9 C) (Oral)   Resp 18   Ht 5\' 11"  (1.803 m)   Wt 78.5 kg   SpO2 100%   BMI 24.13 kg/m  Gen: NAD, alert and oriented x 4 HEENT: PEERLA, EOMI, Neck: supple, no JVD or thyromegaly Chest: No respiratory distress Abd: soft, non-tender, non-distended Ext: no edema, well perfused with 2+ pulses, Skin: no rash or lesions noted Lymph: no lymphadenopathy  Data: Lab Results  Component Value Date   WBC 8.9 11/12/2020   HGB 8.5 (L) 11/12/2020   HCT 26.7 (L) 11/12/2020   MCV 83.2 11/12/2020   PLT 548 (H) 11/12/2020   Recent Labs  Lab 11/11/20 0423 11/11/20 1409 11/12/20 1625  HGB 7.0* 9.0* 8.5*   Lab Results   Component Value Date   NA 137 11/12/2020   K 4.0 11/12/2020   CL 107 11/12/2020   CO2 22 11/12/2020   BUN 32 (H) 11/12/2020   CREATININE 1.34 (H) 11/12/2020   Lab Results  Component Value Date   ALT 28 11/12/2020   AST 17 11/12/2020   ALKPHOS 98 11/12/2020   BILITOT 0.4 11/12/2020   Recent Labs  Lab 11/10/20 1040  APTT <24*  INR 1.2   Assessment/Plan: William Vaughan is a 35 y.o. gentleman with history of crohns that is apparently managed at the 30. Recently started stelara. We have no records and patient does not remember his doctor or when his last colonoscopy was. Improving on IV steroids and today is day 3 of IV steroids  Recommendations:  - please change to PO prednisone 40 mg starting tomorrow - advance diet to solids - stool studies and blood cultures negative so likely does not need any further antibiotics - please avoid IV pain medicines - would be nice to get his records for review but anticipate difficulty in this - will need close follow up with his outpatient GI doctore  Will continue to follow while in house, please call with any questions or concerns  [place] MD, MPH [facility]

## 2020-11-13 NOTE — Progress Notes (Signed)
Progress Note    William Vaughan  XJO:[id number] DOB: 15-Aug-1986  DOA: 11/10/2020 PCP: [facility]      Brief Narrative:    Medical records reviewed and are as summarized below:  William Vaughan is a 35 y.o. male with medical history significant for Crohn's disease, PTSD, anxiety, depression, presented to the hospital with chest pain, abdominal pain and bloody diarrhea.  He has been going to the [place] for treatment of his Crohn's disease.  He was given Ustekinumab about 2 weeks prior to admission.  He was admitted to the hospital for acute flare of Crohn's disease and severe anemia with a hemoglobin of 5.3.  He was treated with IV antibiotics, IV steroids and IV fluids.  He was transfused with 1 unit of packed red blood cells and hemoglobin improved.  Gastroenterologist was consulted to assist with management.  His troponins were elevated on admission but this was likely due to demand ischemia from severe anemia.  Cardiologist was consulted and no additional work-up was recommended.      Assessment/Plan:   Principal Problem:   GI bleed Active Problems:   Adjustment disorder with mixed anxiety and depressed mood   PTSD (post-traumatic stress disorder)   Crohn's disease (HCC)   Severe anemia   AKI (acute kidney injury) (HCC)   GI bleeding   Body mass index is 24.13 kg/m.    Acute flare of Crohn's disease/Crohn's pancolitis: Continue IV steroids.  Plan to switch to oral prednisone tomorrow.  Stool for C. difficile and GI panel were negative.  No growth on blood cultures thus far.  Discontinue IV antibiotics per GI.  Severe acute blood loss anemia: Improved s/p transfusion with 4 units of packed red blood cells.  Monitor H&H.  AKI: Creatinine is slowly improving.  Monitor BMP.  Chest pain and elevated troponins: Troponin peaked at 429.  Likely due to demand ischemia in the setting of severe anemia.  2D echo showed EF estimated at 60 to 65%.  He has been seen by  cardiologist.  No further work-up recommended.  Incidental finding small bowel-small bowel intussusception on CT scan abdomen: Patient was evaluated by the surgeon and no surgery was recommended.  PTSD, anxiety and depression: Continue psychotropics  Seizure disorder: Continue Topamax    Diet Order            Diet clear liquid Room service appropriate? Yes; Fluid consistency: Thin  Diet effective now                    Consultants:  Gastroenterologist  Procedures:  None    Medications:   . ferrous sulfate  325 mg Oral BID WC  . folic acid  1 mg Oral Daily  . methylPREDNISolone (SOLU-MEDROL) injection  60 mg Intravenous Daily  . nortriptyline  50 mg Oral QHS  . QUEtiapine  100 mg Oral QHS  . topiramate  50 mg Oral Daily  . venlafaxine  50 mg Oral BID WC   Continuous Infusions:    Anti-infectives (From admission, onward)   Start     Dose/Rate Route Frequency Ordered Stop   11/10/20 2000  ceFEPIme (MAXIPIME) 2 g in sodium chloride 0.9 % 100 mL IVPB  Status:  Discontinued        2 g 200 mL/hr over 30 Minutes Intravenous Every 8 hours 11/10/20 1621 11/13/20 1341   11/10/20 1600  metroNIDAZOLE (FLAGYL) IVPB 500 mg  Status:  Discontinued  500 mg 100 mL/hr over 60 Minutes Intravenous Every 8 hours 11/10/20 1458 11/13/20 1341   11/10/20 1030  ceFEPIme (MAXIPIME) 2 g in sodium chloride 0.9 % 100 mL IVPB        2 g 200 mL/hr over 30 Minutes Intravenous  Once 11/10/20 1017 11/10/20 1149   11/10/20 1030  metroNIDAZOLE (FLAGYL) IVPB 500 mg        500 mg 100 mL/hr over 60 Minutes Intravenous  Once 11/10/20 1017 11/10/20 1233             Family Communication/Anticipated D/C date and plan/Code Status   DVT prophylaxis: Place TED hose Start: 11/10/20 1343     Code Status: Full Code  Family Communication: None Disposition Plan:    Status is: Inpatient  Remains inpatient appropriate because:IV treatments appropriate due to intensity of illness or  inability to take PO   Dispo: The patient is from: Home              Anticipated d/c is to: Home              Anticipated d/c date is: 2 days              Patient currently is not medically stable to d/c.   Difficult to place patient No           Subjective:   Interval events noted.  She still has abdominal pain but is better.  No chest pain.  No bloody stools or diarrhea.  Objective:    Vitals:   11/12/20 1949 11/13/20 0402 11/13/20 0841 11/13/20 1209  BP: 140/89 136/82 (!) 165/92 118/68  Pulse: 83 82 81 83  Resp: 18 18 18 18   Temp: 98.6 F (37 C) 97.9 F (36.6 C) 97.9 F (36.6 C) 98.5 F (36.9 C)  TempSrc:  Oral Oral Oral  SpO2: 100% 99% 100% 100%  Weight:      Height:       No data found.   Intake/Output Summary (Last 24 hours) at 11/13/2020 1341 Last data filed at 11/13/2020 1210 Gross per 24 hour  Intake 660 ml  Output 1575 ml  Net -915 ml   Filed Weights   11/10/20 0918 11/10/20 0925  Weight: 90.3 kg 78.5 kg    Exam:  GEN: NAD SKIN: No rash EYES: EOMI ENT: MMM CV: RRR PULM: CTA B ABD: soft, lower abdominal tenderness is a little better, NT, +BS CNS: AAO x 3, non focal EXT: No edema or tenderness      Data Reviewed:   I have personally reviewed following labs and imaging studies:  Labs: Labs show the following:   Basic Metabolic Panel: Recent Labs  Lab 11/10/20 0926 11/11/20 0423 11/12/20 1625  NA 135 137 137  K 3.4* 4.4 4.0  CL 106 108 107  CO2 16* 23 22  GLUCOSE 286* 80 112*  BUN 24* 25* 32*  CREATININE 1.55* 1.40* 1.34*  CALCIUM 7.8* 7.7* 8.2*   GFR Estimated Creatinine Clearance: 81.9 mL/min (A) (by C-G formula based on SCr of 1.34 mg/dL (H)). Liver Function Tests: Recent Labs  Lab 11/10/20 0926 11/12/20 1625  AST 33 17  ALT 41 28  ALKPHOS 109 98  BILITOT 0.2* 0.4  PROT 4.8* 5.2*  ALBUMIN 2.2* 2.4*   No results for input(s): LIPASE, AMYLASE in the last 168 hours. No results for input(s): AMMONIA in the  last 168 hours. Coagulation profile Recent Labs  Lab 11/10/20 1040  INR 1.2  CBC: Recent Labs  Lab 11/10/20 0926 11/10/20 1904 11/11/20 0423 11/11/20 1409 11/12/20 1625  WBC 29.2*  --  11.8* 12.9* 8.9  NEUTROABS 22.0*  --   --   --   --   HGB 5.3* 6.8* 7.0* 9.0* 8.5*  HCT 17.3* 21.6* 21.9* 27.0* 26.7*  MCV 84.0  --  80.5 80.6 83.2  PLT 836*  --  440* 496* 548*   Cardiac Enzymes: Recent Labs  Lab 11/10/20 0926  CKTOTAL 60   BNP (last 3 results) No results for input(s): PROBNP in the last 8760 hours. CBG: Recent Labs  Lab 11/10/20 0937  GLUCAP 260*   D-Dimer: No results for input(s): DDIMER in the last 72 hours. Hgb A1c: No results for input(s): HGBA1C in the last 72 hours. Lipid Profile: No results for input(s): CHOL, HDL, LDLCALC, TRIG, CHOLHDL, LDLDIRECT in the last 72 hours. Thyroid function studies: No results for input(s): TSH, T4TOTAL, T3FREE, THYROIDAB in the last 72 hours.  Invalid input(s): FREET3 Anemia work up: No results for input(s): VITAMINB12, FOLATE, FERRITIN, TIBC, IRON, RETICCTPCT in the last 72 hours. Sepsis Labs: Recent Labs  Lab 11/10/20 0926 11/10/20 1320 11/11/20 0423 11/11/20 1409 11/12/20 1625  WBC 29.2*  --  11.8* 12.9* 8.9  LATICACIDVEN 7.6* 1.6  --   --   --     Microbiology Recent Results (from the past 240 hour(s))  Blood Culture (routine x 2)     Status: None (Preliminary result)   Collection Time: 11/10/20  9:36 AM   Specimen: BLOOD  Result Value Ref Range Status   Specimen Description BLOOD LEFT UPPER ARM  Final   Special Requests   Final    BOTTLES DRAWN AEROBIC AND ANAEROBIC Blood Culture results may not be optimal due to an excessive volume of blood received in culture bottles   Culture   Final    NO GROWTH 3 DAYS Performed at [facility], [address], [place], [place] [address]    Report Status PENDING  Incomplete  SARS Coronavirus 2 by RT PCR (hospital order, performed in [facility]  hospital lab) Nasopharyngeal Nasopharyngeal Swab     Status: None   Collection Time: 11/10/20 10:40 AM   Specimen: Nasopharyngeal Swab  Result Value Ref Range Status   SARS Coronavirus 2 NEGATIVE NEGATIVE Final    Comment: (NOTE) SARS-CoV-2 target nucleic acids are NOT DETECTED.  The SARS-CoV-2 RNA is generally detectable in upper and lower respiratory specimens during the acute phase of infection. The lowest concentration of SARS-CoV-2 viral copies this assay can detect is 250 copies / mL. A negative result does not preclude SARS-CoV-2 infection and should not be used as the sole basis for treatment or other patient management decisions.  A negative result may occur with improper specimen collection / handling, submission of specimen other than nasopharyngeal swab, presence of viral mutation(s) within the areas targeted by this assay, and inadequate number of viral copies (<250 copies / mL). A negative result must be combined with clinical observations, patient history, and epidemiological information.  Fact Sheet for Patients:   BoilerBrush.com.cy  Fact Sheet for Healthcare Providers: https://pope.com/  This test is not yet approved or  cleared by the [place] FDA and has been authorized for detection and/or diagnosis of SARS-CoV-2 by FDA under an Emergency Use Authorization (EUA).  This EUA will remain in effect (meaning this test can be used) for the duration of the COVID-19 declaration under Section 564(b)(1) of the Act, 21 U.S.C. section  360bbb-3(b)(1), unless the authorization is terminated or revoked sooner.  Performed at [facility], [address], [place], [place] [address]   Blood Culture (routine x 2)     Status: None (Preliminary result)   Collection Time: 11/10/20 10:40 AM   Specimen: BLOOD  Result Value Ref Range Status   Specimen Description BLOOD RIGHT ANTECUBITAL  Final   Special Requests    Final    BOTTLES DRAWN AEROBIC AND ANAEROBIC Blood Culture results may not be optimal due to an inadequate volume of blood received in culture bottles   Culture   Final    NO GROWTH 3 DAYS Performed at [facility], [address] [place], [place] [address]    Report Status PENDING  Incomplete  Urine culture     Status: None   Collection Time: 11/10/20  9:00 PM   Specimen: Urine, Random  Result Value Ref Range Status   Specimen Description   Final    URINE, RANDOM Performed at [facility], [address] [place], [place] [address]    Special Requests   Final    NONE Performed at [facility], [address], [place], [place] [address]    Culture   Final    NO GROWTH Performed at [facility], [address], [place], [place] [address]    Report Status 11/12/2020 FINAL  Final  Gastrointestinal Panel by PCR , Stool     Status: None   Collection Time: 11/11/20  7:25 AM   Specimen: Stool  Result Value Ref Range Status   Campylobacter species NOT DETECTED NOT DETECTED Final   Plesimonas shigelloides NOT DETECTED NOT DETECTED Final   Salmonella species NOT DETECTED NOT DETECTED Final   Yersinia enterocolitica NOT DETECTED NOT DETECTED Final   Vibrio species NOT DETECTED NOT DETECTED Final   Vibrio cholerae NOT DETECTED NOT DETECTED Final   Enteroaggregative E coli (EAEC) NOT DETECTED NOT DETECTED Final   Enteropathogenic E coli (EPEC) NOT DETECTED NOT DETECTED Final   Enterotoxigenic E coli (ETEC) NOT DETECTED NOT DETECTED Final   Shiga like toxin producing E coli (STEC) NOT DETECTED NOT DETECTED Final   Shigella/Enteroinvasive E coli (EIEC) NOT DETECTED NOT DETECTED Final   Cryptosporidium NOT DETECTED NOT DETECTED Final   Cyclospora cayetanensis NOT DETECTED NOT DETECTED Final   Entamoeba histolytica NOT DETECTED NOT DETECTED Final   Giardia lamblia NOT DETECTED NOT DETECTED Final   Adenovirus F40/41 NOT DETECTED NOT DETECTED Final    Astrovirus NOT DETECTED NOT DETECTED Final   Norovirus GI/GII NOT DETECTED NOT DETECTED Final   Rotavirus A NOT DETECTED NOT DETECTED Final   Sapovirus (I, II, IV, and V) NOT DETECTED NOT DETECTED Final    Comment: Performed at [facility], [address] [place], [place] [address]  C Difficile Quick Screen w PCR reflex     Status: None   Collection Time: 11/11/20  7:25 AM   Specimen: Stool  Result Value Ref Range Status   C Diff antigen NEGATIVE NEGATIVE Final   C Diff toxin NEGATIVE NEGATIVE Final   C Diff interpretation No C. difficile detected.  Final    Comment: Performed at [facility], [address] [place] [place], [place] [address]    Procedures and diagnostic studies:  ECHOCARDIOGRAM COMPLETE  Result Date: 11/12/2020    ECHOCARDIOGRAM REPORT   Patient Name:   William Vaughan Date of Exam: 11/12/2020 Medical Rec #:  [id number]     Height:  71.0 in Accession #:    [id number]    Weight:       173.0 lb Date of Birth:  1986/01/22     BSA:          1.983 m Patient Age:    34 years      BP:           128/71 mmHg Patient Gender: M             HR:           98 bpm. Exam Location:  [facility] Procedure: 2D Echo, Cardiac Doppler and Color Doppler Indications:     Chest Pain R07.9  History:         Patient has no prior history of Echocardiogram examinations. No                  cardiac history listed in chart.  Sonographer:     Cristela Blue RDCS (AE) Referring Phys:  [id number] [facility] AMIN Diagnosing Phys: Julien Nordmann MD IMPRESSIONS  1. Left ventricular ejection fraction, by estimation, is 60 to 65%. The left ventricle has normal function. The left ventricle has no regional wall motion abnormalities. Left ventricular diastolic parameters were normal.  2. Right ventricular systolic function is normal. The right ventricular size is normal. FINDINGS  Left Ventricle: Left ventricular ejection fraction, by estimation, is 60 to 65%. The left ventricle has normal function. The left ventricle has  no regional wall motion abnormalities. The left ventricular internal cavity size was normal in size. There is  no left ventricular hypertrophy. Left ventricular diastolic parameters were normal. Right Ventricle: The right ventricular size is normal. No increase in right ventricular wall thickness. Right ventricular systolic function is normal. Left Atrium: Left atrial size was normal in size. Right Atrium: Right atrial size was normal in size. Pericardium: There is no evidence of pericardial effusion. Mitral Valve: The mitral valve is normal in structure. No evidence of mitral valve regurgitation. No evidence of mitral valve stenosis. Tricuspid Valve: The tricuspid valve is normal in structure. Tricuspid valve regurgitation is not demonstrated. No evidence of tricuspid stenosis. Aortic Valve: The aortic valve is normal in structure. Aortic valve regurgitation is not visualized. No aortic stenosis is present. Aortic valve mean gradient measures 2.5 mmHg. Aortic valve peak gradient measures 4.7 mmHg. Aortic valve area, by VTI measures 2.62 cm. Pulmonic Valve: The pulmonic valve was normal in structure. Pulmonic valve regurgitation is not visualized. No evidence of pulmonic stenosis. Aorta: The aortic root is normal in size and structure. Venous: The inferior vena cava is normal in size with greater than 50% respiratory variability, suggesting right atrial pressure of 3 mmHg. IAS/Shunts: No atrial level shunt detected by color flow Doppler.  LEFT VENTRICLE PLAX 2D LVIDd:         4.23 cm  Diastology LVIDs:         2.90 cm  LV e' medial:    7.72 cm/s LV PW:         1.23 cm  LV E/e' medial:  10.8 LV IVS:        1.01 cm  LV e' lateral:   12.60 cm/s LVOT diam:     2.00 cm  LV E/e' lateral: 6.6 LV SV:         46 LV SV Index:   23 LVOT Area:     3.14 cm  RIGHT VENTRICLE RV Basal diam:  2.71 cm LEFT ATRIUM  Index       RIGHT ATRIUM           Index LA diam:        3.30 cm 1.66 cm/m  RA Area:     13.90 cm LA Vol  (A2C):   71.8 ml 36.22 ml/m RA Volume:   30.00 ml  15.13 ml/m LA Vol (A4C):   38.1 ml 19.22 ml/m LA Biplane Vol: 52.9 ml 26.68 ml/m  AORTIC VALVE                   PULMONIC VALVE AV Area (Vmax):    2.59 cm    PV Vmax:        0.85 m/s AV Area (Vmean):   2.41 cm    PV Peak grad:   2.9 mmHg AV Area (VTI):     2.62 cm    RVOT Peak grad: 4 mmHg AV Vmax:           108.55 cm/s AV Vmean:          75.900 cm/s AV VTI:            0.175 m AV Peak Grad:      4.7 mmHg AV Mean Grad:      2.5 mmHg LVOT Vmax:         89.40 cm/s LVOT Vmean:        58.300 cm/s LVOT VTI:          0.146 m LVOT/AV VTI ratio: 0.83  AORTA Ao Root diam: 3.20 cm MITRAL VALVE               TRICUSPID VALVE MV Area (PHT): 4.89 cm    TR Peak grad:   8.9 mmHg MV Decel Time: 155 msec    TR Vmax:        149.00 cm/s MV E velocity: 83.60 cm/s MV A velocity: 55.70 cm/s  SHUNTS MV E/A ratio:  1.50        Systemic VTI:  0.15 m                            Systemic Diam: 2.00 cm Julien Nordmann MD Electronically signed by Julien Nordmann MD Signature Date/Time: 11/12/2020/11:13:23 AM    Final                LOS: 2 days   Leyah Bocchino  [facility]   Pager on [email or web address]. If 7PM-7AM, please contact night-coverage at [email or web address]     11/13/2020, 1:41 PM

## 2020-11-14 LAB — COMPREHENSIVE METABOLIC PANEL
ALT: 20 U/L (ref 0–44)
AST: 13 U/L — ABNORMAL LOW (ref 15–41)
Albumin: 2.3 g/dL — ABNORMAL LOW (ref 3.5–5.0)
Alkaline Phosphatase: 88 U/L (ref 38–126)
Anion gap: 7 (ref 5–15)
BUN: 30 mg/dL — ABNORMAL HIGH (ref 6–20)
CO2: 25 mmol/L (ref 22–32)
Calcium: 7.9 mg/dL — ABNORMAL LOW (ref 8.9–10.3)
Chloride: 107 mmol/L (ref 98–111)
Creatinine, Ser: 1.22 mg/dL (ref 0.61–1.24)
GFR, Estimated: >60 mL/min (ref >60)
Glucose, Bld: 102 mg/dL — ABNORMAL HIGH (ref 70–99)
Potassium: 3.6 mmol/L (ref 3.5–5.1)
Sodium: 139 mmol/L (ref 135–145)
Total Bilirubin: 0.3 mg/dL (ref 0.3–1.2)
Total Protein: 4.8 g/dL — ABNORMAL LOW (ref 6.5–8.1)

## 2020-11-14 LAB — CBC
HCT: 23.7 % — ABNORMAL LOW (ref 39.0–52.0)
HCT: 27.9 % — ABNORMAL LOW (ref 39.0–52.0)
Hemoglobin: 7.5 g/dL — ABNORMAL LOW (ref 13.0–17.0)
Hemoglobin: 8.9 g/dL — ABNORMAL LOW (ref 13.0–17.0)
MCH: 27 pg (ref 26.0–34.0)
MCH: 27.1 pg (ref 26.0–34.0)
MCHC: 31.6 g/dL (ref 30.0–36.0)
MCHC: 31.9 g/dL (ref 30.0–36.0)
MCV: 85.3 fL (ref 80.0–100.0)
MCV: 84.8 fL (ref 80.0–100.0)
Platelets: 524 10*3/uL — ABNORMAL HIGH (ref 150–400)
Platelets: 566 10*3/uL — ABNORMAL HIGH (ref 150–400)
RBC: 2.78 MIL/uL — ABNORMAL LOW (ref 4.22–5.81)
RBC: 3.29 MIL/uL — ABNORMAL LOW (ref 4.22–5.81)
RDW: 19.3 % — ABNORMAL HIGH (ref 11.5–15.5)
RDW: 18.5 % — ABNORMAL HIGH (ref 11.5–15.5)
WBC: 11 10*3/uL — ABNORMAL HIGH (ref 4.0–10.5)
WBC: 12.3 10*3/uL — ABNORMAL HIGH (ref 4.0–10.5)
nRBC: 0 % (ref 0.0–0.2)
nRBC: 0 % (ref 0.0–0.2)

## 2020-11-14 LAB — RETICULOCYTES
Immature Retic Fract: 29.7 % — ABNORMAL HIGH (ref 2.3–15.9)
RBC.: 2.76 MIL/uL — ABNORMAL LOW (ref 4.22–5.81)
Retic Count, Absolute: 134.4 10*3/uL (ref 19.0–186.0)
Retic Ct Pct: 4.9 % — ABNORMAL HIGH (ref 0.4–3.1)

## 2020-11-14 LAB — IRON AND TIBC
Iron: 31 ug/dL — ABNORMAL LOW (ref 45–182)
Saturation Ratios: 14 % — ABNORMAL LOW (ref 17.9–39.5)
TIBC: 216 ug/dL — ABNORMAL LOW (ref 250–450)
UIBC: 185 ug/dL

## 2020-11-14 LAB — PREPARE RBC (CROSSMATCH)

## 2020-11-14 LAB — VITAMIN B12: Vitamin B-12: 1405 pg/mL — ABNORMAL HIGH (ref 180–914)

## 2020-11-14 LAB — FOLATE: Folate: 5.6 ng/mL — ABNORMAL LOW (ref >5.9)

## 2020-11-14 LAB — FERRITIN: Ferritin: 26 ng/mL (ref 24–336)

## 2020-11-14 MED ORDER — FERROUS SULFATE 325 (65 FE) MG PO TBEC: 325.0000 mg | DELAYED_RELEASE_TABLET | Freq: Two times a day (BID) | ORAL | 3 refills | Status: AC

## 2020-11-14 MED ORDER — HYDROXYZINE PAMOATE 25 MG PO CAPS: 25.0000 mg | ORAL_CAPSULE | Freq: Three times a day (TID) | ORAL | 0 refills | Status: AC | PRN

## 2020-11-14 MED ORDER — SODIUM CHLORIDE 0.9% IV SOLUTION: Freq: Once | INTRAVENOUS | Status: AC

## 2020-11-14 MED ORDER — QUETIAPINE FUMARATE 200 MG PO TABS: 100.0000 mg | ORAL_TABLET | Freq: Every day | ORAL | 0 refills | Status: DC

## 2020-11-14 MED ORDER — FOLIC ACID 1 MG PO TABS: 1.0000 mg | ORAL_TABLET | Freq: Every day | ORAL | 3 refills | Status: AC

## 2020-11-14 MED ORDER — OXYCODONE-ACETAMINOPHEN 7.5-325 MG PO TABS: 1.0000 | ORAL_TABLET | Freq: Four times a day (QID) | ORAL | 0 refills | Status: DC | PRN

## 2020-11-14 MED ORDER — ADULT MULTIVITAMIN W/MINERALS CH: 1.0000 | ORAL_TABLET | Freq: Every day | ORAL | 0 refills | Status: AC

## 2020-11-14 NOTE — TOC Transition Note (Signed)
Transition of Care [facility]) - CM/SW Discharge Note   Patient Details  Name: William Vaughan MRN: [id number] Date of Birth: 1986-05-11  Transition of [facility]) CM/SW Contact:  Bing Quarry, RN Phone Number: 11/14/2020, 4:26 PM   Clinical Narrative:   Patient has discharge orders. Home/self care. No wound care. No PT/OT/DME needs. Gabriel Cirri RN CM    Final next level of care: Home/Self Care Barriers to Discharge: Barriers Resolved   Patient Goals and CMS Choice        Discharge Placement                       Discharge Plan and Services                DME Arranged: N/A DME Agency: NA         HH Agency: NA        Social Determinants of Health (SDOH) Interventions     Readmission Risk Interventions No flowsheet data found.

## 2020-11-14 NOTE — Progress Notes (Signed)
GI Inpatient Follow-up Note  Subjective:  Tolerating diet. Abdominal pain better  Scheduled Inpatient Medications:  . feeding supplement  237 mL Oral TID BM  . ferrous sulfate  325 mg Oral BID WC  . folic acid  1 mg Oral Daily  . methylPREDNISolone (SOLU-MEDROL) injection  60 mg Intravenous Daily  . multivitamin with minerals  1 tablet Oral Daily  . nortriptyline  50 mg Oral QHS  . QUEtiapine  100 mg Oral QHS  . topiramate  50 mg Oral Daily  . venlafaxine  50 mg Oral BID WC    Continuous Inpatient Infusions:     PRN Inpatient Medications:  acetaminophen **OR** acetaminophen, hydrOXYzine, LORazepam, morphine injection, ondansetron **OR** ondansetron (ZOFRAN) IV, oxyCODONE-acetaminophen  Review of Systems:  Review of Systems  Constitutional: Negative for chills and fever.  Respiratory: Negative for cough.   Cardiovascular: Negative for chest pain.  Gastrointestinal: Negative for abdominal pain, blood in stool, diarrhea, nausea and vomiting.  Musculoskeletal: Negative for joint pain.  Skin: Negative for rash.  Neurological: Negative for focal weakness.  All other systems reviewed and are negative.    Physical Examination: BP 137/77   Pulse (!) 106   Temp 98.4 F (36.9 C) (Oral)   Resp 18   Ht 5\' 11"  (1.803 m)   Wt 78.5 kg   SpO2 99%   BMI 24.13 kg/m  Gen: NAD, alert and oriented x 4 HEENT: PEERLA, EOMI, Neck: supple, no JVD or thyromegaly Chest: No respiratory distress Abd: soft, non-tender, non-distended Ext: no edema, well perfused with 2+ pulses, Skin: no rash or lesions noted Lymph: no lymphadenopathy  Data: Lab Results  Component Value Date   WBC 11.0 (H) 11/14/2020   HGB 7.5 (L) 11/14/2020   HCT 23.7 (L) 11/14/2020   MCV 85.3 11/14/2020   PLT 524 (H) 11/14/2020   Recent Labs  Lab 11/11/20 1409 11/12/20 1625 11/14/20 0833  HGB 9.0* 8.5* 7.5*   Lab Results  Component Value Date   NA 139 11/14/2020   K 3.6 11/14/2020   CL 107 11/14/2020    CO2 25 11/14/2020   BUN 30 (H) 11/14/2020   CREATININE 1.22 11/14/2020   Lab Results  Component Value Date   ALT 20 11/14/2020   AST 13 (L) 11/14/2020   ALKPHOS 88 11/14/2020   BILITOT 0.3 11/14/2020   Recent Labs  Lab 11/10/20 1040  APTT <24*  INR 1.2   Assessment/Plan: Mr. Dishman is a 35 y.o. gentleman with history of crohns that is apparently managed at the 30. Recently started stelara. We have no records and patient does not remember his doctor or when his last colonoscopy was.   Recommendations:  - please change to PO prednisone 40 mg starting. Can do 40 mg for one week and 30 mg for one week and then stay at 20 mg until further [facility] - please avoid IV pain medicines - PO iron for when he goes home - Not opposed to potential discharge today  Please call with any questions or concerns  [place] MD, MPH [facility]

## 2020-11-14 NOTE — Discharge Summary (Signed)
Physician Discharge Summary  William Vaughan ZOX:[id number] DOB: 08-31-1986 DOA: 11/10/2020  PCP: [facility]  Admit date: 11/10/2020 Discharge date: 11/14/2020  Admitted From: Home Disposition: Home  Recommendations for Outpatient Follow-up:  1. Follow up with PCP in 1-2 weeks 2. Please obtain BMP/CBC in one week 3. Please follow up on the following pending results: CBC  Home Health: No Equipment/Devices: None Discharge Condition: Stable CODE STATUS: Full Diet recommendation: Heart Healthy   Brief/Interim Summary: William Vaughan is a 35 y.o. male with medical history significant for Crohn's disease, PTSD, anxiety, depression, presented to the hospital with chest pain, abdominal pain and bloody diarrhea.  He has been going to the [place] for treatment of his Crohn's disease.  He was given Ustekinumab about 2 weeks prior to admission.  He was admitted to the hospital for acute flare of Crohn's disease and severe anemia with a hemoglobin of 5.3.  He was treated with IV antibiotics, IV steroids and IV fluids.  He was transfused with 1 unit of packed red blood cells and hemoglobin improved.  Gastroenterologist was consulted to assist with management.  Stool for C. difficile and GI pathogen was negative.  No growth on blood cultures.  Initially given broad-spectrum antibiotics which were discontinued later.  Patient is a [place] patient and none of the records were available.  He will need a repeat colonoscopy once cleared from this acute flare.  He also received high doses of Solu-Medrol, with improvement in his symptoms.  He was advised to continue with prednisone and [facility] for [facility].  Patient received total of 5 units of PRBC while in the hospital.  4 units in the thickening and 1 unit on the day of discharge when CBC started trending down. Anemia panel with folic acid and iron deficiency.  He was also given supplements.  Patient was also found  to have elevated troponin, which peaked at 429.  No chest pain.  Most likely secondary to demand ischemia with severe anemia.  Normal echocardiogram.  Cardiology was consulted and they did not recommend any further work-up at this point.  Incidental finding small bowel-small bowel intussusception on CT scan abdomen: Patient was evaluated by the surgeon and no surgery was recommended.  PTSD, anxiety and depression: Continue psychotropics  Seizure disorder: Continue Topamax.  He will continue rest of his home medications and follow-up with his providers.  Discharge Diagnoses:  Principal Problem:   GI bleed Active Problems:   Adjustment disorder with mixed anxiety and depressed mood   PTSD (post-traumatic stress disorder)   Crohn's disease (HCC)   Severe anemia   AKI (acute kidney injury) (HCC)   GI bleeding   Discharge Instructions  Discharge Instructions    Diet - low sodium heart healthy   Complete by: As directed    Discharge instructions   Complete by: As directed    It was pleasure taking care of you. Continue taking your prednisone as you were taking it before, 30 to 40 mg daily and follow-up with your gastroenterologist at [facility] within next few days. You are being given pain medications to be used only for severe pain, please mindful that it can cause constipation. You can use over-the-counter MiraLAX to help in case you develop constipation. Keep yourself well-hydrated. You are also been started on iron, folic acid and multivitamin supplements, continue taking them as directed.   Increase activity slowly   Complete by: As directed    No wound  care   Complete by: As directed      Allergies as of 11/14/2020      Reactions   Ambien [zolpidem Tartrate]    Violent flashbacks   Food [peanut-containing Drug Products] Other (See Comments)   Peanuts Makes crohn's worse/act up   Diltiazem Rash      Medication List    TAKE these medications   acetaminophen 500 MG  tablet Commonly known as: TYLENOL Take 1,500 mg by mouth every 6 (six) hours as needed for mild pain (swelling).   cyanocobalamin 1000 MCG tablet Take 1,000 mcg by mouth daily.   dicyclomine 20 MG tablet Commonly known as: Bentyl Take 1 tablet (20 mg total) by mouth 3 (three) times daily as needed (abdominal pain).   ferrous sulfate 325 (65 FE) MG EC tablet Take 325 mg by mouth daily with breakfast. What changed: Another medication with the same name was added. Make sure you understand how and when to take each.   ferrous sulfate 325 (65 FE) MG EC tablet Take 1 tablet (325 mg total) by mouth 2 (two) times daily. What changed: You were already taking a medication with the same name, and this prescription was added. Make sure you understand how and when to take each.   folic acid 1 MG tablet Commonly known as: FOLVITE Take 1 mg by mouth daily. What changed: Another medication with the same name was added. Make sure you understand how and when to take each.   folic acid 1 MG tablet Commonly known as: FOLVITE Take 1 tablet (1 mg total) by mouth daily. What changed: You were already taking a medication with the same name, and this prescription was added. Make sure you understand how and when to take each.   hydrOXYzine 25 MG capsule Commonly known as: VISTARIL Take 1 capsule (25 mg total) by mouth 3 (three) times daily as needed.   multivitamin with minerals Tabs tablet Take 1 tablet by mouth daily. Start taking on: November 15, 2020   nortriptyline 50 MG capsule Commonly known as: PAMELOR Take 50 mg by mouth at bedtime.   oxyCODONE-acetaminophen 7.5-325 MG tablet Commonly known as: PERCOCET Take 1 tablet by mouth every 6 (six) hours as needed for moderate pain.   predniSONE 10 MG tablet Commonly known as: DELTASONE Take 30 mg by mouth daily.   QUEtiapine 200 MG tablet Commonly known as: SEROQUEL Take 0.5 tablets (100 mg total) by mouth at bedtime.   topiramate 15 MG  capsule Commonly known as: TOPAMAX Take 60 mg by mouth at bedtime.   venlafaxine 50 MG tablet Commonly known as: EFFEXOR Take 50 mg by mouth 2 (two) times daily with a meal.       Allergies  Allergen Reactions  . Ambien [Zolpidem Tartrate]     Violent flashbacks  . Food [Peanut-Containing Drug Products] Other (See Comments)    Peanuts Makes crohn's worse/act up  . Diltiazem Rash    Consultations:  GI  Cardiology  Procedures/Studies: CT Angio Chest PE W and/or Wo Contrast  Result Date: 11/10/2020 CLINICAL DATA:  Chest pain. EXAM: CT ANGIOGRAPHY CHEST WITH CONTRAST TECHNIQUE: Multidetector CT imaging of the chest was performed using the standard protocol during bolus administration of intravenous contrast. Multiplanar CT image reconstructions and MIPs were obtained to evaluate the vascular anatomy. CONTRAST:  75mL OMNIPAQUE IOHEXOL 350 MG/ML SOLN COMPARISON:  Chest radiograph November 10, 2020 FINDINGS: Cardiovascular: There is no demonstrable pulmonary embolus. There is no thoracic aortic aneurysm or dissection. Visualized great  vessels appear unremarkable. No evident pericardial effusion or pericardial thickening. Mediastinum/Nodes: Visualized thyroid appears unremarkable. No evident thoracic adenopathy. No esophageal lesions are appreciable. Lungs/Pleura: There are scattered areas of mild atelectatic change. No edema or airspace opacity. No appreciable pleural effusions. No pneumothorax. Trachea and major bronchial structures appear patent. Upper Abdomen: Visualized upper abdominal structures appear unremarkable. Musculoskeletal: There is mild thoracic dextroscoliosis. No blastic or lytic bone lesions are evident. There are no chest wall lesions. Review of the MIP images confirms the above findings. IMPRESSION: 1. No evident pulmonary embolus. No thoracic aortic aneurysm or dissection. 2. Scattered areas of mild atelectatic change. No edema or airspace opacity. No pleural effusions. 3.   No evident adenopathy. Electronically Signed   By: Bretta Bang III M.D.   On: 11/10/2020 11:47   CT ABDOMEN PELVIS W CONTRAST  Result Date: 11/10/2020 CLINICAL DATA:  Diarrhea.  History of Crohn's disease EXAM: CT ABDOMEN AND PELVIS WITH CONTRAST TECHNIQUE: Multidetector CT imaging of the abdomen and pelvis was performed using the standard protocol following bolus administration of intravenous contrast. CONTRAST:  75mL OMNIPAQUE IOHEXOL 350 MG/ML SOLN COMPARISON:  None. FINDINGS: Lower chest: Included lung bases are clear.  Heart size is normal. Hepatobiliary: No focal liver abnormality is seen. No gallstones, gallbladder wall thickening, or biliary dilatation. Pancreas: Unremarkable. No pancreatic ductal dilatation or surrounding inflammatory changes. Spleen: There are a few punctate calcifications within the spleen suggesting sequela of chronic granulomatous disease. Spleen otherwise normal in size and appearance. Adrenals/Urinary Tract: Unremarkable adrenal glands. Kidneys enhance symmetrically without focal lesion, stone, or hydronephrosis. Ureters are nondilated. Urinary bladder appears unremarkable. Stomach/Bowel: Findings of mild near pancolonic colitis with mild diffuse colonic wall thickening, loss of haustration, and subtle associated fat stranding. Findings are most evident within the proximal transverse colon and mid descending colon. There are a few filling defects within the region of the hepatic flexure and proximal transverse colon which are felt to most likely represents stool. Underlying colonic mass would be difficult to exclude. Submucosal fat deposition within the terminal ileum. Incidental note of a small bowel-small bowel intussusception in the left hemiabdomen (series 5, image 54). No definite lead point mass lesion. No bowel obstruction. Normal appendix within the right lower quadrant (series 4, image 65). Stomach within normal limits. Vascular/Lymphatic: No significant vascular  findings are present. No enlarged abdominal or pelvic lymph nodes. Reproductive: Prostate is unremarkable. Other: No free fluid. No abdominopelvic fluid collection. No pneumoperitoneum. No abdominal wall hernia. Musculoskeletal: Subtle subchondral cystic change versus tiny erosions of the bilateral SI joints, potentially reflecting sequela of sacroiliitis. No acute or significant osseous findings. IMPRESSION: 1. Findings of mild diffuse pancolitis, most evident within the proximal transverse colon and mid descending colon. Findings are in keeping with patient's reported diagnosis of Crohn's disease. There are a few filling defects within the region of the hepatic flexure and proximal transverse colon which are felt to most likely represent stool. Underlying colonic mass would be difficult to exclude. Correlation with colonoscopy is recommended following resolution of patient's acute symptoms, if not recently performed. 2. Incidental note of a small bowel-small bowel intussusception in the left hemiabdomen. No definite lead point mass lesion. No bowel obstruction. Consider short-term follow-up with CT enterography. 3. Subtle subchondral cystic change versus tiny erosions of the bilateral SI joints, potentially reflecting sequela of sacroiliitis. Electronically Signed   By: Duanne Guess D.O.   On: 11/10/2020 12:02   DG Chest Port 1 View  Result Date: 11/10/2020 CLINICAL DATA:  Diarrhea.  Multiple falls.  Right-sided rib pain. EXAM: PORTABLE CHEST 1 VIEW COMPARISON:  Chest x-ray 07/20/2014. FINDINGS: Mediastinum and hilar structures normal. Heart size normal. Low lung volumes. No focal infiltrate. No pleural effusion or pneumothorax. Mild scoliosis thoracic spine. IMPRESSION: No acute cardiopulmonary disease. Electronically Signed   By: Maisie Fus  Register   On: 11/10/2020 10:25   ECHOCARDIOGRAM COMPLETE  Result Date: 11/12/2020    ECHOCARDIOGRAM REPORT   Patient Name:   William Vaughan Date of Exam: 11/12/2020  Medical Rec #:  [id number]     Height:       71.0 in Accession #:    [id number]    Weight:       173.0 lb Date of Birth:  [birth year]     BSA:          1.983 m Patient Age:    34 years      BP:           128/71 mmHg Patient Gender: M             HR:           98 bpm. Exam Location:  [facility] Procedure: 2D Echo, Cardiac Doppler and Color Doppler Indications:     Chest Pain R07.9  History:         Patient has no prior history of Echocardiogram examinations. No                  cardiac history listed in chart.  Sonographer:     Cristela Blue RDCS (AE) Referring Phys:  [id number] [facility] Norleen Xie Diagnosing Phys: Julien Nordmann MD IMPRESSIONS  1. Left ventricular ejection fraction, by estimation, is 60 to 65%. The left ventricle has normal function. The left ventricle has no regional wall motion abnormalities. Left ventricular diastolic parameters were normal.  2. Right ventricular systolic function is normal. The right ventricular size is normal. FINDINGS  Left Ventricle: Left ventricular ejection fraction, by estimation, is 60 to 65%. The left ventricle has normal function. The left ventricle has no regional wall motion abnormalities. The left ventricular internal cavity size was normal in size. There is  no left ventricular hypertrophy. Left ventricular diastolic parameters were normal. Right Ventricle: The right ventricular size is normal. No increase in right ventricular wall thickness. Right ventricular systolic function is normal. Left Atrium: Left atrial size was normal in size. Right Atrium: Right atrial size was normal in size. Pericardium: There is no evidence of pericardial effusion. Mitral Valve: The mitral valve is normal in structure. No evidence of mitral valve regurgitation. No evidence of mitral valve stenosis. Tricuspid Valve: The tricuspid valve is normal in structure. Tricuspid valve regurgitation is not demonstrated. No evidence of tricuspid stenosis. Aortic Valve: The aortic valve is normal in structure. Aortic  valve regurgitation is not visualized. No aortic stenosis is present. Aortic valve mean gradient measures 2.5 mmHg. Aortic valve peak gradient measures 4.7 mmHg. Aortic valve area, by VTI measures 2.62 cm. Pulmonic Valve: The pulmonic valve was normal in structure. Pulmonic valve regurgitation is not visualized. No evidence of pulmonic stenosis. Aorta: The aortic root is normal in size and structure. Venous: The inferior vena cava is normal in size with greater than 50% respiratory variability, suggesting right atrial pressure of 3 mmHg. IAS/Shunts: No atrial level shunt detected by color flow Doppler.  LEFT VENTRICLE PLAX 2D LVIDd:         4.23 cm  Diastology LVIDs:         2.90 cm  LV e' medial:    7.72 cm/s LV PW:         1.23 cm  LV E/e' medial:  10.8 LV IVS:        1.01 cm  LV e' lateral:   12.60 cm/s LVOT diam:     2.00 cm  LV E/e' lateral: 6.6 LV SV:         46 LV SV Index:   23 LVOT Area:     3.14 cm  RIGHT VENTRICLE RV Basal diam:  2.71 cm LEFT ATRIUM             Index       RIGHT ATRIUM           Index LA diam:        3.30 cm 1.66 cm/m  RA Area:     13.90 cm LA Vol (A2C):   71.8 ml 36.22 ml/m RA Volume:   30.00 ml  15.13 ml/m LA Vol (A4C):   38.1 ml 19.22 ml/m LA Biplane Vol: 52.9 ml 26.68 ml/m  AORTIC VALVE                   PULMONIC VALVE AV Area (Vmax):    2.59 cm    PV Vmax:        0.85 m/s AV Area (Vmean):   2.41 cm    PV Peak grad:   2.9 mmHg AV Area (VTI):     2.62 cm    RVOT Peak grad: 4 mmHg AV Vmax:           108.55 cm/s AV Vmean:          75.900 cm/s AV VTI:            0.175 m AV Peak Grad:      4.7 mmHg AV Mean Grad:      2.5 mmHg LVOT Vmax:         89.40 cm/s LVOT Vmean:        58.300 cm/s LVOT VTI:          0.146 m LVOT/AV VTI ratio: 0.83  AORTA Ao Root diam: 3.20 cm MITRAL VALVE               TRICUSPID VALVE MV Area (PHT): 4.89 cm    TR Peak grad:   8.9 mmHg MV Decel Time: 155 msec    TR Vmax:        149.00 cm/s MV E velocity: 83.60 cm/s MV A velocity: 55.70 cm/s  SHUNTS MV E/A  ratio:  1.50        Systemic VTI:  0.15 m                            Systemic Diam: 2.00 cm Julien Nordmann MD Electronically signed by Julien Nordmann MD Signature Date/Time: 11/12/2020/11:13:23 AM    Final      Subjective: Patient was feeling better when seen today.  Able to tolerate diet well.  Continue to have some abdominal pain which improved with p.o. meds. Occasionally sees some blood with stools.  Discharge Exam: Vitals:   11/14/20 1250 11/14/20 1318  BP: 130/72 137/77  Pulse: (!) 104 (!) 106  Resp: 16 18  Temp: 98.2 F (36.8 C) 98.4 F (36.9 C)  SpO2: 99% 99%   Vitals:   11/14/20 0700 11/14/20 1100 11/14/20 1250 11/14/20 1318  BP: 112/86 132/69 130/72 137/77  Pulse: 83 (!) 101 (!)  104 (!) 106  Resp: 12 12 16 18   Temp: 97.6 F (36.4 C) 97.7 F (36.5 C) 98.2 F (36.8 C) 98.4 F (36.9 C)  TempSrc: Oral Oral Oral Oral  SpO2: 99%  99% 99%  Weight:      Height:        General: Pt is alert, awake, not in acute distress Cardiovascular: RRR, S1/S2 +, no rubs, no gallops Respiratory: CTA bilaterally, no wheezing, no rhonchi Abdominal: Soft, NT, ND, bowel sounds + Extremities: no edema, no cyanosis   The results of significant diagnostics from this hospitalization (including imaging, microbiology, ancillary and laboratory) are listed below for reference.    Microbiology: Recent Results (from the past 240 hour(s))  Blood Culture (routine x 2)     Status: None (Preliminary result)   Collection Time: 11/10/20  9:36 AM   Specimen: BLOOD  Result Value Ref Range Status   Specimen Description BLOOD LEFT UPPER ARM  Final   Special Requests   Final    BOTTLES DRAWN AEROBIC AND ANAEROBIC Blood Culture results may not be optimal due to an excessive volume of blood received in culture bottles   Culture   Final    NO GROWTH 4 DAYS Performed at [facility], [address], [place], [place] [address]    Report Status PENDING  Incomplete  SARS Coronavirus 2 by RT PCR  (hospital order, performed in [facility] hospital lab) Nasopharyngeal Nasopharyngeal Swab     Status: None   Collection Time: 11/10/20 10:40 AM   Specimen: Nasopharyngeal Swab  Result Value Ref Range Status   SARS Coronavirus 2 NEGATIVE NEGATIVE Final    Comment: (NOTE) SARS-CoV-2 target nucleic acids are NOT DETECTED.  The SARS-CoV-2 RNA is generally detectable in upper and lower respiratory specimens during the acute phase of infection. The lowest concentration of SARS-CoV-2 viral copies this assay can detect is 250 copies / mL. A negative result does not preclude SARS-CoV-2 infection and should not be used as the sole basis for treatment or other patient management decisions.  A negative result may occur with improper specimen collection / handling, submission of specimen other than nasopharyngeal swab, presence of viral mutation(s) within the areas targeted by this assay, and inadequate number of viral copies (<250 copies / mL). A negative result must be combined with clinical observations, patient history, and epidemiological information.  Fact Sheet for Patients:   BoilerBrush.com.cy  Fact Sheet for Healthcare Providers: https://pope.com/  This test is not yet approved or  cleared by the [place] FDA and has been authorized for detection and/or diagnosis of SARS-CoV-2 by FDA under an Emergency Use Authorization (EUA).  This EUA will remain in effect (meaning this test can be used) for the duration of the COVID-19 declaration under Section 564(b)(1) of the Act, 21 U.S.C. section 360bbb-3(b)(1), unless the authorization is terminated or revoked sooner.  Performed at [facility] [address] [place], [place] [address]   Blood Culture (routine x 2)     Status: None (Preliminary result)   Collection Time: 11/10/20 10:40 AM   Specimen: BLOOD  Result Value Ref Range Status   Specimen Description BLOOD RIGHT  ANTECUBITAL  Final   Special Requests   Final    BOTTLES DRAWN AEROBIC AND ANAEROBIC Blood Culture results may not be optimal due to an inadequate volume of blood received in culture bottles   Culture   Final    NO GROWTH 4 DAYS Performed at [facility], [address]  [address], [place], [place] [address]    Report Status PENDING  Incomplete  Urine culture     Status: None   Collection Time: 11/10/20  9:00 PM   Specimen: Urine, Random  Result Value Ref Range Status   Specimen Description   Final    URINE, RANDOM Performed at [facility], [address] [place], [place] [address]    Special Requests   Final    NONE Performed at [facility], [address] [place], [place] [address]    Culture   Final    NO GROWTH Performed at [facility], [address], [place], [place] [address]    Report Status 11/12/2020 FINAL  Final  Gastrointestinal Panel by PCR , Stool     Status: None   Collection Time: 11/11/20  7:25 AM   Specimen: Stool  Result Value Ref Range Status   Campylobacter species NOT DETECTED NOT DETECTED Final   Plesimonas shigelloides NOT DETECTED NOT DETECTED Final   Salmonella species NOT DETECTED NOT DETECTED Final   Yersinia enterocolitica NOT DETECTED NOT DETECTED Final   Vibrio species NOT DETECTED NOT DETECTED Final   Vibrio cholerae NOT DETECTED NOT DETECTED Final   Enteroaggregative E coli (EAEC) NOT DETECTED NOT DETECTED Final   Enteropathogenic E coli (EPEC) NOT DETECTED NOT DETECTED Final   Enterotoxigenic E coli (ETEC) NOT DETECTED NOT DETECTED Final   Shiga like toxin producing E coli (STEC) NOT DETECTED NOT DETECTED Final   Shigella/Enteroinvasive E coli (EIEC) NOT DETECTED NOT DETECTED Final   Cryptosporidium NOT DETECTED NOT DETECTED Final   Cyclospora c[place]etanensis NOT DETECTED NOT DETECTED Final   Entamoeba histolytica NOT DETECTED NOT DETECTED Final   Giardia lamblia NOT DETECTED NOT DETECTED Final   Adenovirus  F40/41 NOT DETECTED NOT DETECTED Final   Astrovirus NOT DETECTED NOT DETECTED Final   Norovirus GI/GII NOT DETECTED NOT DETECTED Final   Rotavirus A NOT DETECTED NOT DETECTED Final   Sapovirus (I, II, IV, and V) NOT DETECTED NOT DETECTED Final    Comment: Performed at [facility], [address] [place], [place] [address]  C Difficile Quick Screen w PCR reflex     Status: None   Collection Time: 11/11/20  7:25 AM   Specimen: Stool  Result Value Ref Range Status   C Diff antigen NEGATIVE NEGATIVE Final   C Diff toxin NEGATIVE NEGATIVE Final   C Diff interpretation No C. difficile detected.  Final    Comment: Performed at [facility], [address] [place], [place] [address]     Labs: BNP (last 3 results) No results for input(s): BNP in the last 8760 hours. Basic Metabolic Panel: Recent Labs  Lab 11/10/20 0926 11/11/20 0423 11/12/20 1625 11/14/20 0833  NA 135 137 137 139  K 3.4* 4.4 4.0 3.6  CL 106 108 107 107  CO2 16* 23 22 25   GLUCOSE 286* 80 112* 102*  BUN 24* 25* 32* 30*  CREATININE 1.55* 1.40* 1.34* 1.22  CALCIUM 7.8* 7.7* 8.2* 7.9*   Liver Function Tests: Recent Labs  Lab 11/10/20 0926 11/12/20 1625 11/14/20 0833  AST 33 17 13*  ALT 41 28 20  ALKPHOS 109 98 88  BILITOT 0.2* 0.4 0.3  PROT 4.8* 5.2* 4.8*  ALBUMIN 2.2* 2.4* 2.3*   No results for input(s): LIPASE, AMYLASE in the last 168 hours. No results for input(s): AMMONIA in the last 168 hours. CBC: Recent Labs  Lab 11/10/20 0926 11/10/20 1904  11/11/20 0423 11/11/20 1409 11/12/20 1625 11/14/20 0833  WBC 29.2*  --  11.8* 12.9* 8.9 11.0*  NEUTROABS 22.0*  --   --   --   --   --   HGB 5.3* 6.8* 7.0* 9.0* 8.5* 7.5*  HCT 17.3* 21.6* 21.9* 27.0* 26.7* 23.7*  MCV 84.0  --  80.5 80.6 83.2 85.3  PLT 836*  --  440* 496* 548* 524*   Cardiac Enzymes: Recent Labs  Lab 11/10/20 0926  CKTOTAL 60   BNP: Invalid input(s): POCBNP CBG: Recent Labs  Lab 11/10/20 0937  GLUCAP 260*    D-Dimer No results for input(s): DDIMER in the last 72 hours. Hgb A1c No results for input(s): HGBA1C in the last 72 hours. Lipid Profile No results for input(s): CHOL, HDL, LDLCALC, TRIG, CHOLHDL, LDLDIRECT in the last 72 hours. Thyroid function studies No results for input(s): TSH, T4TOTAL, T3FREE, THYROIDAB in the last 72 hours.  Invalid input(s): FREET3 Anemia work up Recent Labs    11/14/20 0833  VITAMINB12 1,405*  FOLATE 5.6*  FERRITIN 26  TIBC 216*  IRON 31*  RETICCTPCT 4.9*   Urinalysis    Component Value Date/Time   COLORURINE YELLOW (A) 11/10/2020 1040   APPEARANCEUR CLEAR (A) 11/10/2020 1040   LABSPEC 1.039 (H) 11/10/2020 1040   PHURINE 5.0 11/10/2020 1040   GLUCOSEU NEGATIVE 11/10/2020 1040   HGBUR SMALL (A) 11/10/2020 1040   BILIRUBINUR NEGATIVE 11/10/2020 1040   KETONESUR NEGATIVE 11/10/2020 1040   PROTEINUR NEGATIVE 11/10/2020 1040   NITRITE NEGATIVE 11/10/2020 1040   LEUKOCYTESUR NEGATIVE 11/10/2020 1040   Sepsis Labs Invalid input(s): PROCALCITONIN,  WBC,  LACTICIDVEN Microbiology Recent Results (from the past 240 hour(s))  Blood Culture (routine x 2)     Status: None (Preliminary result)   Collection Time: 11/10/20  9:36 AM   Specimen: BLOOD  Result Value Ref Range Status   Specimen Description BLOOD LEFT UPPER ARM  Final   Special Requests   Final    BOTTLES DRAWN AEROBIC AND ANAEROBIC Blood Culture results may not be optimal due to an excessive volume of blood received in culture bottles   Culture   Final    NO GROWTH 4 DAYS Performed at [facility], [address] [place], [place] [address]    Report Status PENDING  Incomplete  SARS Coronavirus 2 by RT PCR (hospital order, performed in [facility]) Nasopharyngeal Nasopharyngeal Swab     Status: None   Collection Time: 11/10/20 10:40 AM   Specimen: Nasopharyngeal Swab  Result Value Ref Range Status   SARS Coronavirus 2 NEGATIVE NEGATIVE Final    Comment:  (NOTE) SARS-CoV-2 target nucleic acids are NOT DETECTED.  The SARS-CoV-2 RNA is generally detectable in upper and lower respiratory specimens during the acute phase of infection. The lowest concentration of SARS-CoV-2 viral copies this assay can detect is 250 copies / mL. A negative result does not preclude SARS-CoV-2 infection and should not be used as the sole basis for treatment or other patient management decisions.  A negative result may occur with improper specimen collection / handling, submission of specimen other than nasopharyngeal swab, presence of viral mutation(s) within the areas targeted by this assay, and inadequate number of viral copies (<250 copies / mL). A negative result must be combined with clinical observations, patient history, and epidemiological information.  Fact Sheet for Patients:   BoilerBrush.com.cyhttps://www.fda.gov/media/136312/download  Fact Sheet for Healthcare Providers: https://pope.com/https://www.fda.gov/media/136313/download  This test is not yet approved or  cleared by  the [employer] and has been authorized for detection and/or diagnosis of SARS-CoV-2 by FDA under an Emergency Use Authorization (EUA).  This EUA will remain in effect (meaning this test can be used) for the duration of the COVID-19 declaration under Section 564(b)(1) of the Act, 21 U.S.C. section 360bbb-3(b)(1), unless the authorization is terminated or revoked sooner.  Performed at [facility], [address] [place], [place] [address]   Blood Culture (routine x 2)     Status: None (Preliminary result)   Collection Time: 11/10/20 10:40 AM   Specimen: BLOOD  Result Value Ref Range Status   Specimen Description BLOOD RIGHT ANTECUBITAL  Final   Special Requests   Final    BOTTLES DRAWN AEROBIC AND ANAEROBIC Blood Culture results may not be optimal due to an inadequate volume of blood received in culture bottles   Culture   Final    NO GROWTH 4 DAYS Performed at [facility],  [address], [place], [place] [address]    Report Status PENDING  Incomplete  Urine culture     Status: None   Collection Time: 11/10/20  9:00 PM   Specimen: Urine, Random  Result Value Ref Range Status   Specimen Description   Final    URINE, RANDOM Performed at [facility], [address] [place], [place] [address]    Special Requests   Final    NONE Performed at [facility], [address], [place], [place] [address]    Culture   Final    NO GROWTH Performed at [facility], [address] [place], [place] [address]    Report Status 11/12/2020 FINAL  Final  Gastrointestinal Panel by PCR , Stool     Status: None   Collection Time: 11/11/20  7:25 AM   Specimen: Stool  Result Value Ref Range Status   Campylobacter species NOT DETECTED NOT DETECTED Final   Plesimonas shigelloides NOT DETECTED NOT DETECTED Final   Salmonella species NOT DETECTED NOT DETECTED Final   Yersinia enterocolitica NOT DETECTED NOT DETECTED Final   Vibrio species NOT DETECTED NOT DETECTED Final   Vibrio cholerae NOT DETECTED NOT DETECTED Final   Enteroaggregative E coli (EAEC) NOT DETECTED NOT DETECTED Final   Enteropathogenic E coli (EPEC) NOT DETECTED NOT DETECTED Final   Enterotoxigenic E coli (ETEC) NOT DETECTED NOT DETECTED Final   Shiga like toxin producing E coli (STEC) NOT DETECTED NOT DETECTED Final   Shigella/Enteroinvasive E coli (EIEC) NOT DETECTED NOT DETECTED Final   Cryptosporidium NOT DETECTED NOT DETECTED Final   Cyclospora cayetanensis NOT DETECTED NOT DETECTED Final   Entamoeba histolytica NOT DETECTED NOT DETECTED Final   Giardia lamblia NOT DETECTED NOT DETECTED Final   Adenovirus F40/41 NOT DETECTED NOT DETECTED Final   Astrovirus NOT DETECTED NOT DETECTED Final   Norovirus GI/GII NOT DETECTED NOT DETECTED Final   Rotavirus A NOT DETECTED NOT DETECTED Final   Sapovirus (I, II, IV, and V) NOT DETECTED NOT DETECTED Final    Comment: Performed at  [facility], [address] [place], [place] [address]  C Difficile Quick Screen w PCR reflex     Status: None   Collection Time: 11/11/20  7:25 AM   Specimen: Stool  Result Value Ref Range Status   C Diff antigen NEGATIVE NEGATIVE Final   C Diff toxin NEGATIVE NEGATIVE Final   C Diff interpretation No C. difficile detected.  Final    Comment: Performed at [facility], [address], [place], [place] [address]    Time coordinating discharge: Over 30 minutes  SIGNED:  Arnetha Courser, MD  [facility] 11/14/2020, 3:06 PM  If 7PM-7AM, please contact night-coverage [email or web address]  This record has been created using Conservation officer, historic buildings. Errors have been sought and corrected,but may not always be located. Such creation errors do not reflect on the standard of care.

## 2020-11-14 NOTE — Plan of Care (Signed)
Continuing with plan of care. 

## 2020-11-14 NOTE — Plan of Care (Signed)
Discharge teaching completed with patient who is in stable condition. 

## 2020-11-15 LAB — CULTURE, BLOOD (ROUTINE X 2)
Culture: NO GROWTH
Culture: NO GROWTH

## 2020-11-15 LAB — BPAM RBC
Blood Product Expiration Date: 202203062359
ISSUE DATE / TIME: 202201301258
Unit Type and Rh: 6200

## 2020-11-15 LAB — TYPE AND SCREEN
ABO/RH(D): A POS
Antibody Screen: NEGATIVE
Unit division: 0

## 2021-04-21 ENCOUNTER — Inpatient Hospital Stay
Admission: EM | Admit: 2021-04-21 | Discharge: 2021-04-24 | DRG: 387 | Disposition: A | Discharge status: Home / Self Care | Payer: No Typology Code available for payment source | Attending: Internal Medicine | Admitting: Internal Medicine

## 2021-04-21 ENCOUNTER — Emergency Department: Payer: No Typology Code available for payment source

## 2021-04-21 ENCOUNTER — Other Ambulatory Visit: Payer: Self-pay

## 2021-04-21 ENCOUNTER — Encounter: Payer: Self-pay | Admitting: Emergency Medicine

## 2021-04-21 DIAGNOSIS — K50111 Crohn's disease of large intestine with rectal bleeding: Secondary | ICD-10-CM | POA: Diagnosis not present

## 2021-04-21 DIAGNOSIS — K625 Hemorrhage of anus and rectum: Secondary | ICD-10-CM

## 2021-04-21 DIAGNOSIS — F4310 Post-traumatic stress disorder, unspecified: Secondary | ICD-10-CM | POA: Diagnosis present

## 2021-04-21 DIAGNOSIS — Z888 Allergy status to other drugs, medicaments and biological substances status: Secondary | ICD-10-CM

## 2021-04-21 DIAGNOSIS — K50119 Crohn's disease of large intestine with unspecified complications: Secondary | ICD-10-CM

## 2021-04-21 DIAGNOSIS — Z87820 Personal history of traumatic brain injury: Secondary | ICD-10-CM

## 2021-04-21 DIAGNOSIS — F17290 Nicotine dependence, other tobacco product, uncomplicated: Secondary | ICD-10-CM | POA: Diagnosis present

## 2021-04-21 DIAGNOSIS — K5010 Crohn's disease of large intestine without complications: Secondary | ICD-10-CM | POA: Diagnosis present

## 2021-04-21 DIAGNOSIS — D500 Iron deficiency anemia secondary to blood loss (chronic): Secondary | ICD-10-CM | POA: Diagnosis present

## 2021-04-21 DIAGNOSIS — Z20822 Contact with and (suspected) exposure to covid-19: Secondary | ICD-10-CM | POA: Diagnosis present

## 2021-04-21 DIAGNOSIS — Z79899 Other long term (current) drug therapy: Secondary | ICD-10-CM

## 2021-04-21 DIAGNOSIS — K921 Melena: Secondary | ICD-10-CM

## 2021-04-21 DIAGNOSIS — E876 Hypokalemia: Secondary | ICD-10-CM | POA: Diagnosis present

## 2021-04-21 HISTORY — DX: Sleep apnea, unspecified: G47.30

## 2021-04-21 LAB — CBC
HCT: 33.8 % — ABNORMAL LOW (ref 39.0–52.0)
Hemoglobin: 11 g/dL — ABNORMAL LOW (ref 13.0–17.0)
MCH: 26.8 pg (ref 26.0–34.0)
MCHC: 32.5 g/dL (ref 30.0–36.0)
MCV: 82.4 fL (ref 80.0–100.0)
Platelets: 450 10*3/uL — ABNORMAL HIGH (ref 150–400)
RBC: 4.1 MIL/uL — ABNORMAL LOW (ref 4.22–5.81)
RDW: 14.7 % (ref 11.5–15.5)
WBC: 10 10*3/uL (ref 4.0–10.5)
nRBC: 0 % (ref 0.0–0.2)

## 2021-04-21 LAB — COMPREHENSIVE METABOLIC PANEL
ALT: 17 U/L (ref 0–44)
AST: 21 U/L (ref 15–41)
Albumin: 3.9 g/dL (ref 3.5–5.0)
Alkaline Phosphatase: 116 U/L (ref 38–126)
Anion gap: 6 (ref 5–15)
BUN: 21 mg/dL — ABNORMAL HIGH (ref 6–20)
CO2: 22 mmol/L (ref 22–32)
Calcium: 9 mg/dL (ref 8.9–10.3)
Chloride: 112 mmol/L — ABNORMAL HIGH (ref 98–111)
Creatinine, Ser: 1.27 mg/dL — ABNORMAL HIGH (ref 0.61–1.24)
GFR, Estimated: >60 mL/min (ref >60)
Glucose, Bld: 108 mg/dL — ABNORMAL HIGH (ref 70–99)
Potassium: 3.9 mmol/L (ref 3.5–5.1)
Sodium: 140 mmol/L (ref 135–145)
Total Bilirubin: 0.3 mg/dL (ref 0.3–1.2)
Total Protein: 7.1 g/dL (ref 6.5–8.1)

## 2021-04-21 LAB — HEMOGLOBIN AND HEMATOCRIT, BLOOD
HCT: 32 % — ABNORMAL LOW (ref 39.0–52.0)
Hemoglobin: 10.4 g/dL — ABNORMAL LOW (ref 13.0–17.0)

## 2021-04-21 MED ORDER — ONDANSETRON HCL 4 MG/2ML IJ SOLN
4.0000 mg | Freq: Once | INTRAMUSCULAR | Status: AC
  Administered 2021-04-21: 4 mg via INTRAVENOUS
  Filled 2021-04-21: qty 2

## 2021-04-21 MED ORDER — MORPHINE SULFATE (PF) 4 MG/ML IV SOLN
4.0000 mg | Freq: Once | INTRAVENOUS | Status: AC
Start: 2021-04-21 — End: 2021-04-21
  Administered 2021-04-21: 4 mg via INTRAVENOUS
  Filled 2021-04-21: qty 1

## 2021-04-21 MED ORDER — IOHEXOL 350 MG/ML SOLN
100.0000 mL | Freq: Once | INTRAVENOUS | Status: AC | PRN
  Administered 2021-04-21: 100 mL via INTRAVENOUS

## 2021-04-21 MED ORDER — IOHEXOL 9 MG/ML PO SOLN
500.0000 mL | ORAL | Status: AC
  Administered 2021-04-21 (×2): 500 mL via ORAL

## 2021-04-21 NOTE — ED Triage Notes (Signed)
Pt reports that he has a history of chrons and today he lost a lot of bright red blood. Pt appears pale during triage. VSS, testing on phone during triage.

## 2021-04-21 NOTE — ED Notes (Signed)
Transfer information sent to [facility] ti start transfer process

## 2021-04-21 NOTE — ED Provider Notes (Signed)
[facility] Emergency Department Provider Note  ____________________________________________   Event Date/Time   First MD Initiated Contact with Patient 04/21/21 2022     (approximate)  I have reviewed the triage vital signs and the nursing notes.   HISTORY  Chief Complaint Rectal Bleeding    HPI William Vaughan is a 35 y.o. male who has a history of Crohn's disease.  He often has chronic abdominal pain 6 out of 10.  Today is gone up to 7 and 7-1/2 out of 10.  He had an episode of bright red blood per rectum before he got here and was weak and lightheaded feeling.  He had a second episode later.  Orthostatics however are negative.  His H&H dropped slightly over time.  [facility] and [facility] Stelara.  His next and last dose is tomorrow.      Past Medical History:  Diagnosis Date   Crohn's disease (HCC) 12/2008   PTSD (post-traumatic stress disorder)    Seizures (HCC)    Traumatic brain injury [facility])     Patient Active Problem List   Diagnosis Date Noted   GI bleeding 11/12/2020   Diarrhea    Elevated troponin    GI bleed 11/10/2020   Severe anemia 11/10/2020   AKI (acute kidney injury) (HCC) 11/10/2020   Cannabis use disorder, moderate, dependence (HCC) 08/24/2016   Suicidal ideation 08/23/2016   Chronic pain 08/23/2016   Adjustment disorder with mixed anxiety and depressed mood 06/20/2016   PTSD (post-traumatic stress disorder) 06/20/2016   Involuntary commitment 06/20/2016   Toothache 06/20/2016   Crohn's disease (HCC) 12/14/2008    History reviewed. No pertinent surgical history.  Prior to Admission medications   Medication Sig Start Date End Date Taking? Authorizing Provider  acetaminophen (TYLENOL) 500 MG tablet Take 1,500 mg by mouth every 6 (six) hours as needed for mild pain (swelling).    [provider]  cyanocobalamin 1000 MCG tablet Take 1,000 mcg by mouth daily.    [provider]  dicyclomine (BENTYL)  20 MG tablet Take 1 tablet (20 mg total) by mouth 3 (three) times daily as needed (abdominal pain). 12/11/18   Phineas Semen, MD  ferrous sulfate 325 (65 FE) MG EC tablet Take 325 mg by mouth daily with breakfast.    [provider]  ferrous sulfate 325 (65 FE) MG EC tablet Take 1 tablet (325 mg total) by mouth 2 (two) times daily. 11/14/20 11/14/21  Arnetha Courser, MD  folic acid (FOLVITE) 1 MG tablet Take 1 mg by mouth daily.    [provider]  folic acid (FOLVITE) 1 MG tablet Take 1 tablet (1 mg total) by mouth daily. 11/14/20 11/14/21  Arnetha Courser, MD  hydrOXYzine (VISTARIL) 25 MG capsule Take 1 capsule (25 mg total) by mouth 3 (three) times daily as needed. 11/14/20   Arnetha Courser, MD  Multiple Vitamin (MULTIVITAMIN WITH MINERALS) TABS tablet Take 1 tablet by mouth daily. 11/15/20   Arnetha Courser, MD  nortriptyline (PAMELOR) 50 MG capsule Take 50 mg by mouth at bedtime.    [provider]  oxyCODONE-acetaminophen (PERCOCET) 7.5-325 MG tablet Take 1 tablet by mouth every 6 (six) hours as needed for moderate pain. 11/14/20   Arnetha Courser, MD  predniSONE (DELTASONE) 10 MG tablet Take 30 mg by mouth daily. 09/10/20   [provider]  QUEtiapine (SEROQUEL) 200 MG tablet Take 0.5 tablets (100 mg total) by mouth at bedtime. 11/14/20   Arnetha Courser,  MD  topiramate (TOPAMAX) 15 MG capsule Take 60 mg by mouth at bedtime.    [provider]  venlafaxine (EFFEXOR) 50 MG tablet Take 50 mg by mouth 2 (two) times daily with a meal.    [provider]    Allergies Ambien [zolpidem tartrate], Food [peanut-containing drug products], and Diltiazem  No family history on file.  Social History Social History   Tobacco Use   Smoking status: Every Day    Pack years: 0.00    Types: Cigars   Smokeless tobacco: Never  Substance Use Topics   Alcohol use: Yes   Drug use: Not Currently    Review of Systems  Constitutional: No fever/chills Eyes: No  visual changes. ENT: No sore throat. Cardiovascular: Denies chest pain. Respiratory: Denies shortness of breath. Gastrointestinal: abdominal pain.  No nausea, no vomiting.  No diarrhea.  No constipation. Genitourinary: Negative for dysuria. Musculoskeletal: Negative for back pain. Skin: Negative for rash. Neurological: Negative for headaches, focal weakness  ____________________________________________   PHYSICAL EXAM:  VITAL SIGNS: ED Triage Vitals [04/21/21 1806]  Enc Vitals Group     BP 116/76     Pulse Rate 97     Resp 20     Temp 99 F (37.2 C)     Temp Source Oral     SpO2 99 %     Weight 182 lb (82.6 kg)     Height 5\' 11"  (1.803 m)     Head Circumference      Peak Flow      Pain Score 6     Pain Loc      Pain Edu?      Excl. in GC?    Constitutional: Alert and oriented.  Looks pale and tired Eyes: Conjunctivae are slightly pale Head: Atraumatic. Nose: No congestion/rhinnorhea. Mouth/Throat: Mucous membranes are moist.  Oropharynx non-erythematous. Neck: No stridor.  Cardiovascular: Normal rate, regular rhythm. Grossly normal heart sounds.  Good peripheral circulation. Respiratory: Normal respiratory effort.  No retractions. Lungs CTAB. Gastrointestinal: Soft diffusely tender to palpation percussion no distention. No abdominal bruits. No CVA tenderness. Rectal: Very tender there is bright red blood per rectum on exam musculoskeletal: No lower extremity tenderness nor edema. Neurologic:  Normal speech and language. No gross focal neurologic deficits are appreciated.  Skin:  Skin is warm, dry and intact. No rash noted.   ____________________________________________   LABS (all labs ordered are listed, but only abnormal results are displayed)  Labs Reviewed  COMPREHENSIVE METABOLIC PANEL - Abnormal; Notable for the following components:      Result Value   Chloride 112 (*)    Glucose, Bld 108 (*)    BUN 21 (*)    Creatinine, Ser 1.27 (*)    All other  components within normal limits  CBC - Abnormal; Notable for the following components:   RBC 4.10 (*)    Hemoglobin 11.0 (*)    HCT 33.8 (*)    Platelets 450 (*)    All other components within normal limits  HEMOGLOBIN AND HEMATOCRIT, BLOOD - Abnormal; Notable for the following components:   Hemoglobin 10.4 (*)    HCT 32.0 (*)    All other components within normal limits  POC OCCULT BLOOD, ED  TYPE AND SCREEN   ____________________________________________  EKG   ____________________________________________  RADIOLOGY , personally viewed and evaluated these images (plain radiographs) as part of my medical decision making, as well as reviewing the written report by the radiologist.  ED MD interpretation: CT read by radiology reviewed by me does not show any signs of acute problems  Official radiology report(s): CT ABDOMEN PELVIS W CONTRAST  Result Date: 04/21/2021 CLINICAL DATA:  Nonlocalized acute abdominal pain. EXAM: CT ABDOMEN AND PELVIS WITH CONTRAST TECHNIQUE: Multidetector CT imaging of the abdomen and pelvis was performed using the standard protocol following bolus administration of intravenous contrast. CONTRAST:  OMNIPAQUE IOHEXOL 350 MG/ML SOLN COMPARISON:  None. FINDINGS: Lower chest: No acute abnormality. Hepatobiliary: No focal liver abnormality. No gallstones, gallbladder wall thickening, or pericholecystic fluid. No biliary dilatation. Pancreas: No focal lesion. Normal pancreatic contour. No surrounding inflammatory changes. No main pancreatic ductal dilatation. Spleen: Multiple punctate calcifications likely sequelae of prior granulomatous disease. Normal in size without focal abnormality. Adrenals/Urinary Tract: No adrenal nodule bilaterally. Bilateral kidneys enhance symmetrically. No hydronephrosis. No hydroureter. The urinary bladder is unremarkable. Stomach/Bowel: Stomach is within normal limits. No evidence of small bowel wall thickening or  dilatation. Diffuse large bowel wall thickening and mucosal hyperemia. Mile pericolonic fat stranding. No pneumatosis. Appendix appears normal. Vascular/Lymphatic: No abdominal aorta or iliac aneurysm. Mild atherosclerotic plaque of the aorta and its branches. No abdominal, pelvic, or inguinal lymphadenopathy. Reproductive: Prostate is unremarkable. Other: No intraperitoneal free fluid. No intraperitoneal free gas. No organized fluid collection. Musculoskeletal: No abdominal wall hernia or abnormality. No suspicious lytic or blastic osseous lesions. No acute displaced fracture. IMPRESSION: Pancolitis.  No bowel obstruction, perforation, signs of ischemia. Electronically Signed   By: Tish Frederickson M.D.   On: 04/21/2021 23:01    ____________________________________________   PROCEDURES  Procedure(s) performed (including Critical Care):  Procedures   ____________________________________________   INITIAL IMPRESSION / ASSESSMENT AND PLAN / ED COURSE  [facility].  No old records are available on care everywhere.  [facility].  Meantime I discussed the patient in detail with Dr. Allegra Lai she recommends admission and will likely start him on steroids.  We will admit him here if the [place] does not take him.     ----------------------------------------- 11:53 PM on 04/21/2021 ----------------------------------------- Still waiting to hear from the [place] at this point.  Plan is still to either admit him or send him to the [place].        ____________________________________________   FINAL CLINICAL IMPRESSION(S) / ED DIAGNOSES  Final diagnoses:  Rectal bleeding     ED Discharge Orders     None        Note:  This document was prepared using Dragon voice recognition software and may include unintentional dictation errors.    Arnaldo Natal, MD 04/21/21 [phone]

## 2021-04-21 NOTE — ED Notes (Signed)
Pt completed oral contrast, CT called and notified at this time.

## 2021-04-22 ENCOUNTER — Encounter: Payer: Self-pay | Admitting: Internal Medicine

## 2021-04-22 DIAGNOSIS — K921 Melena: Secondary | ICD-10-CM

## 2021-04-22 DIAGNOSIS — K5010 Crohn's disease of large intestine without complications: Secondary | ICD-10-CM

## 2021-04-22 DIAGNOSIS — Z79899 Other long term (current) drug therapy: Secondary | ICD-10-CM | POA: Diagnosis not present

## 2021-04-22 DIAGNOSIS — K589 Irritable bowel syndrome without diarrhea: Secondary | ICD-10-CM

## 2021-04-22 DIAGNOSIS — F4310 Post-traumatic stress disorder, unspecified: Secondary | ICD-10-CM

## 2021-04-22 DIAGNOSIS — K50111 Crohn's disease of large intestine with rectal bleeding: Secondary | ICD-10-CM | POA: Diagnosis present

## 2021-04-22 DIAGNOSIS — K625 Hemorrhage of anus and rectum: Secondary | ICD-10-CM | POA: Diagnosis present

## 2021-04-22 DIAGNOSIS — K50119 Crohn's disease of large intestine with unspecified complications: Secondary | ICD-10-CM | POA: Diagnosis not present

## 2021-04-22 DIAGNOSIS — F17290 Nicotine dependence, other tobacco product, uncomplicated: Secondary | ICD-10-CM | POA: Diagnosis present

## 2021-04-22 DIAGNOSIS — Z20822 Contact with and (suspected) exposure to covid-19: Secondary | ICD-10-CM | POA: Diagnosis present

## 2021-04-22 DIAGNOSIS — E876 Hypokalemia: Secondary | ICD-10-CM | POA: Diagnosis present

## 2021-04-22 DIAGNOSIS — Z888 Allergy status to other drugs, medicaments and biological substances status: Secondary | ICD-10-CM | POA: Diagnosis not present

## 2021-04-22 DIAGNOSIS — Z87820 Personal history of traumatic brain injury: Secondary | ICD-10-CM | POA: Diagnosis not present

## 2021-04-22 DIAGNOSIS — D500 Iron deficiency anemia secondary to blood loss (chronic): Secondary | ICD-10-CM | POA: Diagnosis present

## 2021-04-22 LAB — CBC
HCT: 33.3 % — ABNORMAL LOW (ref 39.0–52.0)
Hemoglobin: 10.9 g/dL — ABNORMAL LOW (ref 13.0–17.0)
MCH: 26.5 pg (ref 26.0–34.0)
MCHC: 32.7 g/dL (ref 30.0–36.0)
MCV: 81 fL (ref 80.0–100.0)
Platelets: 412 10*3/uL — ABNORMAL HIGH (ref 150–400)
RBC: 4.11 MIL/uL — ABNORMAL LOW (ref 4.22–5.81)
RDW: 14.8 % (ref 11.5–15.5)
WBC: 13.9 10*3/uL — ABNORMAL HIGH (ref 4.0–10.5)
nRBC: 0 % (ref 0.0–0.2)

## 2021-04-22 LAB — RESP PANEL BY RT-PCR (FLU A&B, COVID) ARPGX2
Influenza A by PCR: NEGATIVE
Influenza B by PCR: NEGATIVE
SARS Coronavirus 2 by RT PCR: NEGATIVE

## 2021-04-22 LAB — BASIC METABOLIC PANEL
Anion gap: 8 (ref 5–15)
BUN: 20 mg/dL (ref 6–20)
CO2: 19 mmol/L — ABNORMAL LOW (ref 22–32)
Calcium: 8.8 mg/dL — ABNORMAL LOW (ref 8.9–10.3)
Chloride: 109 mmol/L (ref 98–111)
Creatinine, Ser: 1.2 mg/dL (ref 0.61–1.24)
GFR, Estimated: >60 mL/min (ref >60)
Glucose, Bld: 126 mg/dL — ABNORMAL HIGH (ref 70–99)
Potassium: 3.7 mmol/L (ref 3.5–5.1)
Sodium: 136 mmol/L (ref 135–145)

## 2021-04-22 LAB — C DIFFICILE QUICK SCREEN W PCR REFLEX
C Diff antigen: NEGATIVE
C Diff interpretation: NOT DETECTED
C Diff toxin: NEGATIVE

## 2021-04-22 LAB — HEMOGLOBIN: Hemoglobin: 10.9 g/dL — ABNORMAL LOW (ref 13.0–17.0)

## 2021-04-22 MED ORDER — HYDROCODONE-ACETAMINOPHEN 5-325 MG PO TABS
1.0000 | ORAL_TABLET | ORAL | Status: DC | PRN
  Administered 2021-04-22 (×2): 2 via ORAL
  Administered 2021-04-22: 1 via ORAL
  Administered 2021-04-23 (×3): 2 via ORAL
  Filled 2021-04-22 (×2): qty 2
  Filled 2021-04-22: qty 1
  Filled 2021-04-22 (×3): qty 2

## 2021-04-22 MED ORDER — MORPHINE SULFATE (PF) 4 MG/ML IV SOLN
4.0000 mg | Freq: Once | INTRAVENOUS | Status: AC
  Administered 2021-04-22: 4 mg via INTRAVENOUS
  Filled 2021-04-22: qty 1

## 2021-04-22 MED ORDER — ACETAMINOPHEN 650 MG RE SUPP: 650.0000 mg | Freq: Four times a day (QID) | RECTAL | Status: DC | PRN

## 2021-04-22 MED ORDER — MORPHINE SULFATE (PF) 2 MG/ML IV SOLN
2.0000 mg | INTRAVENOUS | Status: AC | PRN
Start: 2021-04-22 — End: 2021-04-23
  Administered 2021-04-22 – 2021-04-23 (×2): 2 mg via INTRAVENOUS
  Filled 2021-04-22 (×2): qty 1

## 2021-04-22 MED ORDER — ONDANSETRON HCL 4 MG/2ML IJ SOLN
4.0000 mg | Freq: Four times a day (QID) | INTRAMUSCULAR | Status: DC | PRN
  Administered 2021-04-23 (×2): 4 mg via INTRAVENOUS
  Filled 2021-04-22 (×2): qty 2

## 2021-04-22 MED ORDER — ONDANSETRON HCL 4 MG PO TABS
4.0000 mg | ORAL_TABLET | Freq: Four times a day (QID) | ORAL | Status: DC | PRN
  Administered 2021-04-23: 4 mg via ORAL
  Filled 2021-04-22: qty 1

## 2021-04-22 MED ORDER — PEG 3350-KCL-NA BICARB-NACL 420 G PO SOLR
4000.0000 mL | Freq: Once | ORAL | Status: AC
  Administered 2021-04-22: 4000 mL via ORAL
  Filled 2021-04-22: qty 4000

## 2021-04-22 MED ORDER — SODIUM CHLORIDE 0.9 % IV SOLN: INTRAVENOUS | Status: DC

## 2021-04-22 MED ORDER — METHYLPREDNISOLONE SODIUM SUCC 125 MG IJ SOLR
125.0000 mg | Freq: Once | INTRAMUSCULAR | Status: AC
  Administered 2021-04-22: 125 mg via INTRAVENOUS
  Filled 2021-04-22: qty 2

## 2021-04-22 MED ORDER — MORPHINE SULFATE (PF) 2 MG/ML IV SOLN
2.0000 mg | INTRAVENOUS | Status: AC | PRN
  Administered 2021-04-22 (×2): 2 mg via INTRAVENOUS
  Filled 2021-04-22 (×2): qty 1

## 2021-04-22 MED ORDER — ACETAMINOPHEN 325 MG PO TABS: 650.0000 mg | ORAL_TABLET | Freq: Four times a day (QID) | ORAL | Status: DC | PRN

## 2021-04-22 NOTE — Plan of Care (Signed)
Continuing with plan of care. 

## 2021-04-22 NOTE — Progress Notes (Signed)
Same day rounding progress note  Patient seen and examined in the ED.  Waiting for the floor bed.  Please see Dr. Lianne Bushy dictated history and physical for further details.  Patient denies any further bleeding. Case discussed with GI Dr. Maximino Greenland.  She is requesting GI panel and stool for C. difficile for now,  Based on which colonoscopy may be needed. He has not had any bowel movement while here in the hospital.  Time spent: 20 minutes

## 2021-04-22 NOTE — ED Notes (Signed)
Lab at bedside

## 2021-04-22 NOTE — Progress Notes (Signed)
Patient complained of dizziness after ambulating to the bathroom and passing bloody stool. Patient currently drinking bowel prep. Notified Dr.Duncan about it and she ordered labs to check hemoglobin.  Will continue to monitor.  William Vaughan  04/22/2021  11:18 PM

## 2021-04-22 NOTE — Consult Note (Addendum)
Melodie Bouillon, MD [address], [place], [place], [address] [address], [address], [place], [place], [address] Phone: [phone]  Fax: [phone]  Consultation  Referring Provider:     Dr. Sherryll Burger Primary Care Physician:  [facility] Reason for Consultation:     Crohn's disease Primary gastroenterologist: [facility]  Date of Admission:  04/21/2021 Date of Consultation:  04/22/2021         HPI:   William Vaughan is a 35 y.o. male with history of ileocolonic Crohn's disease, history of anal fissure, history of C. difficile colitis requiring admission in 2012, ankylosing spondylitis, uveitis, admitted with 1 day history of hematochezia, with CT showing pancolitis.  Patient reports at baseline he may have anywhere from 3 bowel movements a day, with minute amount of blood streaks in stool or toilet paper, and to no bowel movements for 3 to 4 days at a time.  However, yesterday he had frank bright red blood per rectum, x4, with the last episode being 7 PM yesterday, with no further episodes today.  Describes diffuse abdominal pain and states as baseline he has abdominal pain, that is dull, nonradiating, usually 5/10.  States pain today is 7/10.  No nausea or vomiting.  No dysphagia.  No weight loss.  No fever or chills.  States he is on Stelara for his Crohn's disease and is compliant with it.  States he was due for a dose today as last dose was 8 weeks ago.  States he was supposed to go in for blood work today to evaluate drug levels and was going to take the medication after the blood work was drawn.  I was able to find GI note in Care Everywhere.  See detailed GI note from January 13, 2021 in Care Everywhere.  As per the note, patient has previously been on infliximab, adalimumab and was started on Stelara in January 2022.  He was admitted to [facility] in January 2022 with Crohn's flare and was on steroids during that admission that were tapered at the time of beginning  Stelara.  The note also states the patient has chronic abdominal pain that has been attributed to functional symptoms/IBS and patient is on TCA, SNRI, for this as well.  Their plan was to continue Stelara injections every 8 weeks, with a level check before the second or third injection, nightly Canasa suppositories, as needed loperamide, and possible initiation of pregabalin for chronic abdominal pain.  His last EGD and colonoscopy were in March 2022 with findings as follows:  " EGD 01/04/21 Findings normal esophagus normal stomach - biopsied per protocol for H.pylori normal duodenum  Colonoscopy 01/04/21 Findings Normal rectal exam Normal terminal ileum up to 25cm from ICV Pancolitis characterized by innumberable small apthous ulcers, edema,  erythema and loss of vascular markings in most parts of the examined colon.  Rare patches of healthier appearing mucosa. Biopsies obtained from the  right colon (jar 2), left colon (jar 3) and rectum (jar 4). Retroflexion in the rectum was not performed due to the extensive colitis. Results discussed with Dr. Judie Grieve.  1. STOMACH, ENDOSCOPIC BIOPSY:  GASTRIC MUCOSA WITH CHRONIC GASTRITIS AND NON-NECROTIZING GRANULOMAS. NO ACTIVE GASTRITIS IS SEEN. H. PYLORI ORGANISMS ARE NOT IDENTIFIED. SEE COMMENT.  2. RIGHT COLON, ENDOSCOPIC BIOPSY:  FOCAL ACUTE COLITIS. NEGATIVE FOR DYSPLASIA AND CARCINOMA. SEE COMMENT.  3. LEFT COLON, ENDOSCOPIC BIOPSY:  ACUTE COLITIS WITH FOCAL CHRONIC ARCHITECTURAL CHANGES AND NON- NECROTIZING GRANULOMAS. NEGATIVE FOR DYSPLASIA AND CARCINOMA.  SEE COMMENT.  4. RECTUM, ENDOSCOPIC BIOPSY:  ACUTE PROCTITIS WITH FOCAL CHRONIC ARCHITECTURAL CHANGES AND NON- NECROTIZING GRANULOMAS. NEGATIVE FOR DYSPLASIA AND CARCINOMA. SEE COMMENT.  Comment: The biopsies from the colon and rectum (specimens 2-4) show  acute colitis with focal evidence crypt architectural distortion. While  the chronic architectural changes  are not particularly promient, the  findings of acute colitis along with endoscopic evidence of pancolitis  and non-necrotizing granulomas (including in the stomach) are sufficient  for a diagnosis of INFLAMMATORY BOWEL DISEASE, SPECIFICALLY CROHN'S  DISEASE (due to the presence of granulomas). This case was reviewed by  Dr. Jacinta Shoe, and he agrees with the above diagnoses.   Colonoscopy 12/2018 Findings 1.) Normal terminal ileum, intubated to at least 10-15cm; Biopsies taken  (Jar 1) 2.) One small (3-1mm) polyp in cecum, NOT ASSOCIATED WITH SURROUNDING  INFLAMMATION, removed w/ cold snare (Jar 2) 3.) Normal appearing colonic mucosa throught, except mild loss of vascularity  in left colon, and mild erythema and granularity in rectum and rectosigmoid  (specifically targeted on respective biopsies) - Biopsies taken per IBD protocol (q10cm, 4-quadrant) into right colon  (Jar 3), Transverse (Jar 4), left colon (Jar 5), and rectosigmoid (Jar  6) 4.) Few, diminuitive pseudopolyps were identified in distal left colon  (at least one was sampled on biopsies) 5.) Small/moderate internal hemorrhoids and hypertrophied papilla in anal  canal  Diagnosis:  1. TERMINAL ILEUM (BIOPSY): ILEAL MUCOSA WITH NO PATHOLOGIC DIAGNOSIS;  NEGATIVE FOR CHRONIC AND ACTIVE ENTERITIS.  2. RIGHT COLON POLYP (POLYPECTOMY):  SESSILE SERRATED ADENOMATOUS POLYP;  NEGATIVE FOR HIGH-GRADE DYSPLASIA.  3. RIGHT COLON (BIOPSY): COLONIC MUCOSA WITH NO PATHOLOGIC DIAGNOSIS;  NEGATIVE FOR CHRONIC/ACTIVE COLITIS AND DYSPLASIA.  4. TRANSVERSE COLON (BIOPSY): COLONIC MUCOSA WITH NO PATHOLOGIC DIAGNOSIS;  NEGATIVE FOR CHRONIC/ACTIVE COLITIS AND DYSPLASIA.  5. LEFT COLON (BIOPSY): COLONIC MUCOSA WITH NO PATHOLOGIC DIAGNOSIS;  NEGATIVE FOR CHRONIC/ACTIVE COLITIS AND DYSPLASIA.  6. RECTOSIGMOID COLON (BIOPSY): COLORECTAL MUCOSA WITH NO PATHOLOGIC DIAGNOSIS;  NEGATIVE FOR CHRONIC/ACTIVE COLITIS AND  DYSPLASIA. "  Past Medical History:  Diagnosis Date  . Crohn's disease (HCC) 12/2008  . PTSD (post-traumatic stress disorder)   . Seizures (HCC)   . Traumatic brain injury [facility])     Past surgical history: Oral surgery due tooth decay  Prior to Admission medications   Medication Sig Start Date End Date Taking? Authorizing Provider  cyanocobalamin 1000 MCG tablet Take 1,000 mcg by mouth daily.   Yes [provider]  ferrous sulfate 325 (65 FE) MG EC tablet Take 1 tablet (325 mg total) by mouth 2 (two) times daily. Patient taking differently: Take 325 mg by mouth daily. 11/14/20 11/14/21 Yes Arnetha Courser, MD  folic acid (FOLVITE) 1 MG tablet Take 1 tablet (1 mg total) by mouth daily. 11/14/20 11/14/21 Yes Arnetha Courser, MD  hydrOXYzine (VISTARIL) 25 MG capsule Take 1 capsule (25 mg total) by mouth 3 (three) times daily as needed. 11/14/20  Yes Arnetha Courser, MD  Multiple Vitamin (MULTIVITAMIN WITH MINERALS) TABS tablet Take 1 tablet by mouth daily. 11/15/20  Yes Arnetha Courser, MD  nortriptyline (PAMELOR) 50 MG capsule Take 50 mg by mouth at bedtime.   Yes [provider]  QUEtiapine (SEROQUEL) 200 MG tablet Take 0.5 tablets (100 mg total) by mouth at bedtime. 11/14/20  Yes Arnetha Courser, MD  Suvorexant (BELSOMRA PO) Take by mouth at bedtime.   Yes [provider]  topiramate (TOPAMAX) 15 MG capsule Take 60 mg by mouth at bedtime.   Yes [provider]  ustekinumab (STELARA) 90 MG/ML SOSY injection Inject 90 mg into the skin. Every 8 weeks 12/02/20  Yes [provider]  venlafaxine (EFFEXOR) 50 MG tablet Take 50 mg by mouth daily.   Yes [provider]    Family history: Sister with IBS. No family history of colon cancer. Father had polio.   Social History   Tobacco Use  . Smoking status: Every Day    Pack years: 0.00    Types: Cigars  . Smokeless tobacco: Never  Substance Use Topics  . Alcohol use: Yes  . Drug use: Not Currently     Allergies as of 04/21/2021 - Review Complete 04/21/2021  Allergen Reaction Noted  . Ambien [zolpidem tartrate]  11/10/2020  . Food [peanut-containing drug products] Other (See Comments) 11/10/2020  . Diltiazem Rash 09/09/2020    Review of Systems:    All systems reviewed and negative except where noted in HPI.   Physical Exam:  Constitutional: General:   Alert,  Well-developed, well-nourished, pleasant and cooperative in NAD BP 133/85 (BP Location: Left Arm)   Pulse 74   Temp 97.8 F (36.6 C) (Oral)   Resp 18   Ht 5\' 11"  (1.803 m)   Wt 82.6 kg   SpO2 100%   BMI 25.38 kg/m   Eyes:  Sclera clear, no icterus.   Conjunctiva pink. PERRLA  Ears:  No scars, lesions or masses, Normal auditory acuity. Nose:  No deformity, discharge, or lesions. Mouth:  No deformity or lesions, oropharynx pink & moist.  Neck:  Supple; no masses or thyromegaly.  Respiratory: Normal respiratory effort, Normal percussion  Gastrointestinal:   Soft, tender to mild palpation diffusely (although pt appears quite comfortable when entering the room) and non-distended without masses, hepatosplenomegaly or hernias noted.  No guarding or rebound tenderness.     Cardiac: No clubbing or edema.  No cyanosis. Normal posterior tibial pedal pulses noted.  Lymphatic:  No significant cervical or axillary adenopathy.  Psych:  Alert and cooperative. Normal mood and affect.  Musculoskeletal:  Head normocephalic, atraumatic. Symmetrical without gross deformities. 5/5 Upper and Lower extremity strength bilaterally.  Skin: Warm. Intact without significant lesions or rashes. No jaundice.  Neurologic:  Face symmetrical, tongue midline, Normal sensation to touch;  grossly normal neurologically.  Psych:  Alert and oriented x3, Alert and cooperative. Normal mood and affect.   LAB RESULTS: Recent Labs    04/21/21 1810 04/21/21 2052 04/22/21 0711  WBC 10.0  --  13.9*  HGB 11.0* 10.4* 10.9*  HCT 33.8* 32.0*  33.3*  PLT 450*  --  412*   BMET Recent Labs    04/21/21 1810 04/22/21 0711  NA 140 136  K 3.9 3.7  CL 112* 109  CO2 22 19*  GLUCOSE 108* 126*  BUN 21* 20  CREATININE 1.27* 1.20  CALCIUM 9.0 8.8*   LFT Recent Labs    04/21/21 1810  PROT 7.1  ALBUMIN 3.9  AST 21  ALT 17  ALKPHOS 116  BILITOT 0.3   PT/INR No results for input(s): LABPROT, INR in the last 72 hours.  STUDIES: CT ABDOMEN PELVIS W CONTRAST  Result Date: 04/21/2021 CLINICAL DATA:  Nonlocalized acute abdominal pain. EXAM: CT ABDOMEN AND PELVIS WITH CONTRAST TECHNIQUE: Multidetector CT imaging of the abdomen and pelvis was performed using the standard protocol following bolus administration of intravenous contrast. CONTRAST:  OMNIPAQUE IOHEXOL 350 MG/ML SOLN COMPARISON:  None. FINDINGS: Lower chest: No acute abnormality. Hepatobiliary: No focal liver abnormality. No  gallstones, gallbladder wall thickening, or pericholecystic fluid. No biliary dilatation. Pancreas: No focal lesion. Normal pancreatic contour. No surrounding inflammatory changes. No main pancreatic ductal dilatation. Spleen: Multiple punctate calcifications likely sequelae of prior granulomatous disease. Normal in size without focal abnormality. Adrenals/Urinary Tract: No adrenal nodule bilaterally. Bilateral kidneys enhance symmetrically. No hydronephrosis. No hydroureter. The urinary bladder is unremarkable. Stomach/Bowel: Stomach is within normal limits. No evidence of small bowel wall thickening or dilatation. Diffuse large bowel wall thickening and mucosal hyperemia. Mile pericolonic fat stranding. No pneumatosis. Appendix appears normal. Vascular/Lymphatic: No abdominal aorta or iliac aneurysm. Mild atherosclerotic plaque of the aorta and its branches. No abdominal, pelvic, or inguinal lymphadenopathy. Reproductive: Prostate is unremarkable. Other: No intraperitoneal free fluid. No intraperitoneal free gas. No organized fluid collection.  Musculoskeletal: No abdominal wall hernia or abnormality. No suspicious lytic or blastic osseous lesions. No acute displaced fracture. IMPRESSION: Pancolitis.  No bowel obstruction, perforation, signs of ischemia. Electronically Signed   By: Tish Frederickson M.D.   On: 04/21/2021 23:01      Impression / Plan:   LEV CERVONE is a 35 y.o. y/o male with history of Crohn's ileocolitis, with pancolitis seen on March 2022 colonoscopy at the [place], on Stelara since January 2022, with last prednisone treatment for colon flare being in January 2022 at [facility], history of chronic abdominal pain attributed to IBS and on medications for this as well, admitted for hematochezia for 1 day, with imaging showing pancolitis on this admission with previous admission for C. difficile in 2012  Infectious vs Inflammatory:  Given that patient admits to compliance with the Stelara and took his dose in a timely fashion 8 weeks ago, his current symptoms may be due to infectious etiology such as C. difficile, especially given his previous history of C. difficile as well  In addition, patient reports taking antibiotics recently for allergies which puts C. difficile high on the differential as well  However, please note that his March 2022 colonoscopy had active endoscopic disease despite pt already being on stelara since Jan 2022, so this presentation could also be due to Crohn's flare  Plan:  I discussed the above in detail with Dr. Sherryll Burger, and he has ordered GI infectious panel and C. difficile testing.  However, patient has not had a bowel movement today.  Therefore, if he does not have any further bowel movements, and he is reporting last bowel movement to be at 7 PM yesterday, it would be important to proceed with endoscopic evaluation to evaluate for Crohn's flare, to avoid delaying procedures and subsequent plan of care such as steroid treatment to induce remission.  Based on my above discussion with primary team, the plan  would be to start a colonoscopy prep later this evening (if he does not have any further bowel movement to allow for infectious testing beforehand), as usually, acute infection would lead to further bowel movements. Starting a colonoscopy prep would both allow for timely start of colonoscopy prep and avoid delaying the procedure and determining etiology of symptoms in this case, and still allow for collection of stool for testing upon start of the prep (and since pt has not had a BM since 7 PM last night delaying starting the prep is not advisable at this time). Benefits of starting prep outweigh risks at this time.  Once a colonoscopy prep is started, stool can still be collected and sent for testing as already ordered.  If the testing comes back positive, need for colonoscopy can be  readdressed and possibly postponed or canceled.  However, if it is negative, a colonoscopy would certainly be indicated to evaluate his Crohn's disease activity  I did also reach out to the pharmacist to ask if it would be possible to give the patient his Stelara dose that he is due for today.  Pharmacist states, this is not available as an inpatient and patient would need to provide the medication himself.  I have again communicated this with the primary team and the patient.  If patient is able to get the medication from home, Stelara can be administered to the patient once infectious work-up is negative. However, if this is a Crohns flare we will also need to consider prednisone and taper, and possible changes in maintenance therapy as appropriate.   I have discussed alternative options, risks & benefits,  which include, but are not limited to, bleeding, infection, perforation,respiratory complication & drug reaction.  The patient agrees with this plan & written consent will be obtained.    Labs do show anemia from his hematochezia with Hgb of 11, which is improved from his Jan admission  White count is elevated, but pt  is afebrile. Continue to follow. Electrolytes normal and liver enzymes normal  Need for antibiotics to be addressed based on any recurrent diarrhea, stool testing, white count, fever. He does not have any evidence of peritonitis at this time and therefore antibiotics have not been started at this time.  In addition, given his previous history of C. difficile, requiring hospitalization, will hold off on empiric antibiotics at this time unless indicated after stool testing, to prevent risks of C. Diff associated with antibiotics in general  His abdomen is soft and previous notes document abdominal pain even with mild palpation attributed to functional symptoms and some of his abdominal pain complaints is likely from his baseline IBS in addition to the pancolitis on this admission  Dr. Servando SnareWohl will be following the patient over the weekend   Thank you for involving me in the care of this patient.      LOS: 0 days   Pasty SpillersVarnita B Michaelene Dutan, MD  04/22/2021, 2:38 PM

## 2021-04-22 NOTE — H&P (Signed)
History and Physical    William Vaughan WHQ:[id number] DOB: [birth year] DOA: 04/21/2021  PCP: [facility]   Patient coming from: home  I have personally briefly reviewed patient's old medical records in [facility]  Chief Complaint: rectal bleeding , abdominal pain  HPI: William Vaughan is a 35 y.o. male with medical history significant for Crohn's disease, PTSD, anxiety and depression who usually gets his care at the [place] who presents to the emergency room with an episode of rectal bleeding on the day of arrival followed by severe lower abdominal pain typical for his Crohn's flare.  He was last hospitalized 7 months prior with similar presentation with hemoglobin of 5.3 requiring a unit of blood.  He denies nausea, vomiting, dysuria and denies cough, chest pain or shortness of breath.  His abdominal pain is severe, crampy and intermittent mostly in the left lower quadrant radiating to the left flank.  Has had no fever or chills. ED course: On arrival temperature 99, BP 116/76, pulse 97 respirations 20 O2 sat 99% on room air.  Blood work significant for hemoglobin 10> 10.4, normal WBC, creatinine 1.27 which is about his baseline Imaging: CT abdomen and pelvis with pancolitis.  No bowel obstruction, perforation or signs of ischemia  Patient treated with methylprednisolone 125 mg.  Hospitalist consulted for admission.  Review of Systems: As per HPI otherwise all other systems on review of systems negative.    Past Medical History:  Diagnosis Date   Crohn's disease (HCC) 12/2008   PTSD (post-traumatic stress disorder)    Seizures (HCC)    Traumatic brain injury (HCC)     History reviewed. No pertinent surgical history.   reports that he has been smoking cigars. He has never used smokeless tobacco. He reports current alcohol use. He reports previous drug use.  Allergies  Allergen Reactions   Ambien [Zolpidem Tartrate]     Violent flashbacks   Food [Peanut-Containing Drug  Products] Other (See Comments)    Peanuts Makes crohn's worse/act up   Diltiazem Rash    Family history reviewed.  No pertinent family history   Prior to Admission medications   Medication Sig Start Date End Date Taking? Authorizing Provider  acetaminophen (TYLENOL) 500 MG tablet Take 1,500 mg by mouth every 6 (six) hours as needed for mild pain (swelling).    [provider]  cyanocobalamin 1000 MCG tablet Take 1,000 mcg by mouth daily.    [provider]  dicyclomine (BENTYL) 20 MG tablet Take 1 tablet (20 mg total) by mouth 3 (three) times daily as needed (abdominal pain). 12/11/18   Phineas Semen, MD  ferrous sulfate 325 (65 FE) MG EC tablet Take 325 mg by mouth daily with breakfast.    [provider]  ferrous sulfate 325 (65 FE) MG EC tablet Take 1 tablet (325 mg total) by mouth 2 (two) times daily. 11/14/20 11/14/21  Arnetha Courser, MD  folic acid (FOLVITE) 1 MG tablet Take 1 mg by mouth daily.    [provider]  folic acid (FOLVITE) 1 MG tablet Take 1 tablet (1 mg total) by mouth daily. 11/14/20 11/14/21  Arnetha Courser, MD  hydrOXYzine (VISTARIL) 25 MG capsule Take 1 capsule (25 mg total) by mouth 3 (three) times daily as needed. 11/14/20   Arnetha Courser, MD  Multiple Vitamin (MULTIVITAMIN WITH MINERALS) TABS tablet Take 1 tablet by mouth daily. 11/15/20   Arnetha Courser, MD  nortriptyline (PAMELOR) 50 MG capsule Take 50 mg by mouth  at bedtime.    [provider]  oxyCODONE-acetaminophen (PERCOCET) 7.5-325 MG tablet Take 1 tablet by mouth every 6 (six) hours as needed for moderate pain. 11/14/20   Arnetha Courser, MD  predniSONE (DELTASONE) 10 MG tablet Take 30 mg by mouth daily. 09/10/20   [provider]  QUEtiapine (SEROQUEL) 200 MG tablet Take 0.5 tablets (100 mg total) by mouth at bedtime. 11/14/20   Arnetha Courser, MD  topiramate (TOPAMAX) 15 MG capsule Take 60 mg by mouth at bedtime.    [provider]  venlafaxine (EFFEXOR)  50 MG tablet Take 50 mg by mouth 2 (two) times daily with a meal.    [provider]    Physical Exam: Vitals:   04/21/21 2230 04/21/21 2300 04/22/21 0000 04/22/21 0100  BP: 131/85 126/80 135/84 139/83  Pulse: 71 68 76 80  Resp:  16 18 16   Temp:      TempSrc:      SpO2: 100% 100% 100% 100%  Weight:      Height:         Vitals:   04/21/21 2230 04/21/21 2300 04/22/21 0000 04/22/21 0100  BP: 131/85 126/80 135/84 139/83  Pulse: 71 68 76 80  Resp:  16 18 16   Temp:      TempSrc:      SpO2: 100% 100% 100% 100%  Weight:      Height:          Constitutional: Alert and oriented x 3 . Not in any apparent distress HEENT:      Head: Normocephalic and atraumatic.         Eyes: PERLA, EOMI, Conjunctivae are normal. Sclera is non-icteric.       Mouth/Throat: Mucous membranes are moist.       Neck: Supple with no signs of meningismus. Cardiovascular: Regular rate and rhythm. No murmurs, gallops, or rubs. 2+ symmetrical distal pulses are present . No JVD. No LE edema Respiratory: Respiratory effort normal .Lungs sounds clear bilaterally. No wheezes, crackles, or rhonchi.  Gastrointestinal: Soft, diffuse tenderness with mild palpation, and non distended with positive bowel sounds.  Genitourinary: No CVA tenderness. Musculoskeletal: Nontender with normal range of motion in all extremities. No cyanosis, or erythema of extremities. Neurologic:  Face is symmetric. Moving all extremities. No gross focal neurologic deficits . Skin: Skin is warm, dry.  No rash or ulcers Psychiatric: Mood and affect are normal    Labs on Admission: I have personally reviewed following labs and imaging studies  CBC: Recent Labs  Lab 04/21/21 1810 04/21/21 2052  WBC 10.0  --   HGB 11.0* 10.4*  HCT 33.8* 32.0*  MCV 82.4  --   PLT 450*  --    Basic Metabolic Panel: Recent Labs  Lab 04/21/21 1810  NA 140  K 3.9  CL 112*  CO2 22  GLUCOSE 108*  BUN 21*  CREATININE 1.27*  CALCIUM 9.0    GFR: Estimated Creatinine Clearance: 86.5 mL/min (A) (by C-G formula based on SCr of 1.27 mg/dL (H)). Liver Function Tests: Recent Labs  Lab 04/21/21 1810  AST 21  ALT 17  ALKPHOS 116  BILITOT 0.3  PROT 7.1  ALBUMIN 3.9   No results for input(s): LIPASE, AMYLASE in the last 168 hours. No results for input(s): AMMONIA in the last 168 hours. Coagulation Profile: No results for input(s): INR, PROTIME in the last 168 hours. Cardiac Enzymes: No results for input(s): CKTOTAL, CKMB, CKMBINDEX, TROPONINI in the last 168 hours. BNP (last  3 results) No results for input(s): PROBNP in the last 8760 hours. HbA1C: No results for input(s): HGBA1C in the last 72 hours. CBG: No results for input(s): GLUCAP in the last 168 hours. Lipid Profile: No results for input(s): CHOL, HDL, LDLCALC, TRIG, CHOLHDL, LDLDIRECT in the last 72 hours. Thyroid Function Tests: No results for input(s): TSH, T4TOTAL, FREET4, T3FREE, THYROIDAB in the last 72 hours. Anemia Panel: No results for input(s): VITAMINB12, FOLATE, FERRITIN, TIBC, IRON, RETICCTPCT in the last 72 hours. Urine analysis:    Component Value Date/Time   COLORURINE YELLOW (A) 11/10/2020 1040   APPEARANCEUR CLEAR (A) 11/10/2020 1040   LABSPEC 1.039 (H) 11/10/2020 1040   PHURINE 5.0 11/10/2020 1040   GLUCOSEU NEGATIVE 11/10/2020 1040   HGBUR SMALL (A) 11/10/2020 1040   BILIRUBINUR NEGATIVE 11/10/2020 1040   KETONESUR NEGATIVE 11/10/2020 1040   PROTEINUR NEGATIVE 11/10/2020 1040   NITRITE NEGATIVE 11/10/2020 1040   LEUKOCYTESUR NEGATIVE 11/10/2020 1040    Radiological Exams on Admission: CT ABDOMEN PELVIS W CONTRAST  Result Date: 04/21/2021 CLINICAL DATA:  Nonlocalized acute abdominal pain. EXAM: CT ABDOMEN AND PELVIS WITH CONTRAST TECHNIQUE: Multidetector CT imaging of the abdomen and pelvis was performed using the standard protocol following bolus administration of intravenous contrast. CONTRAST:  OMNIPAQUE IOHEXOL 350 MG/ML  SOLN COMPARISON:  None. FINDINGS: Lower chest: No acute abnormality. Hepatobiliary: No focal liver abnormality. No gallstones, gallbladder wall thickening, or pericholecystic fluid. No biliary dilatation. Pancreas: No focal lesion. Normal pancreatic contour. No surrounding inflammatory changes. No main pancreatic ductal dilatation. Spleen: Multiple punctate calcifications likely sequelae of prior granulomatous disease. Normal in size without focal abnormality. Adrenals/Urinary Tract: No adrenal nodule bilaterally. Bilateral kidneys enhance symmetrically. No hydronephrosis. No hydroureter. The urinary bladder is unremarkable. Stomach/Bowel: Stomach is within normal limits. No evidence of small bowel wall thickening or dilatation. Diffuse large bowel wall thickening and mucosal hyperemia. Mile pericolonic fat stranding. No pneumatosis. Appendix appears normal. Vascular/Lymphatic: No abdominal aorta or iliac aneurysm. Mild atherosclerotic plaque of the aorta and its branches. No abdominal, pelvic, or inguinal lymphadenopathy. Reproductive: Prostate is unremarkable. Other: No intraperitoneal free fluid. No intraperitoneal free gas. No organized fluid collection. Musculoskeletal: No abdominal wall hernia or abnormality. No suspicious lytic or blastic osseous lesions. No acute displaced fracture. IMPRESSION: Pancolitis.  No bowel obstruction, perforation, signs of ischemia. Electronically Signed   By: Tish Frederickson M.D.   On: 04/21/2021 23:01     Assessment/Plan 35 year old male with history of Crohn's disease, PTSD, anxiety and depression who usually gets his care at the [place]  an episode of rectal bleeding on the day of arrival followed by severe lower abdominal pain typical for his Crohn's flare.       Crohn's colitis    Hematochezia -CT abdomen and pelvis with pancolitis.  No bowel obstruction, perforation or signs of ischemia - V Solu-Medrol 60 mg daily - Pain control - Clear liquid diet - Serial H&H and  transfuse if necessary.  Hemoglobin stable so far at 10/10.4 - GI consult    PTSD (post-traumatic stress disorder) - Continue home meds pending med rec    DVT prophylaxis: SCDs Code Status: full code  Family Communication:  none  Disposition Plan: Back to previous home environment Consults called: GI Status:At the time of admission, it appears that the appropriate admission status for this patient is INPATIENT. This is judged to be reasonable and necessary in order to provide the required intensity of service to ensure the patient's safety given the presenting symptoms, physical  exam findings, and initial radiographic and laboratory data in the context of their  Comorbid conditions.   Patient requires inpatient status due to high intensity of service, high risk for further deterioration and high frequency of surveillance required.   I certify that at the point of admission it is my clinical judgment that the patient will require inpatient hospital care spanning beyond 2 midnights     Andris Baumann MD [facility]     04/22/2021, 3:22 AM

## 2021-04-22 NOTE — ED Notes (Signed)
Pt requesting additional pain meds.

## 2021-04-22 NOTE — ED Provider Notes (Signed)
-----------------------------------------   1:36 AM on 04/22/2021 ----------------------------------------- Patient care assumed from Dr. Juliette Alcide.  [facility]  We will admit to our hospital.  We will continue with pain control.  Patient given IV Solu-Medrol.  GI medicine will consult we will admit to the hospital service.   Minna Antis, MD 04/22/21 [phone]

## 2021-04-22 NOTE — ED Notes (Signed)
Pt stated he recently had covid 4 weeks ago.

## 2021-04-22 NOTE — ED Notes (Signed)
Pt visualized resting at this time, RR even and unlabored. Stretcher locked in lowest position. Call bell in reach

## 2021-04-23 ENCOUNTER — Inpatient Hospital Stay: Payer: No Typology Code available for payment source | Admitting: Certified Registered"

## 2021-04-23 ENCOUNTER — Encounter
Admission: EM | Disposition: A | Discharge status: Home / Self Care | Payer: Self-pay | Source: Home / Self Care | Attending: Internal Medicine

## 2021-04-23 ENCOUNTER — Encounter: Payer: Self-pay | Admitting: Internal Medicine

## 2021-04-23 DIAGNOSIS — K50119 Crohn's disease of large intestine with unspecified complications: Secondary | ICD-10-CM

## 2021-04-23 DIAGNOSIS — E876 Hypokalemia: Secondary | ICD-10-CM

## 2021-04-23 DIAGNOSIS — K921 Melena: Secondary | ICD-10-CM

## 2021-04-23 HISTORY — PX: COLONOSCOPY: SHX5424

## 2021-04-23 LAB — GASTROINTESTINAL PANEL BY PCR, STOOL (REPLACES STOOL CULTURE)

## 2021-04-23 LAB — CBC
HCT: 28.1 % — ABNORMAL LOW (ref 39.0–52.0)
Hemoglobin: 9.3 g/dL — ABNORMAL LOW (ref 13.0–17.0)
MCH: 27.1 pg (ref 26.0–34.0)
MCHC: 33.1 g/dL (ref 30.0–36.0)
MCV: 81.9 fL (ref 80.0–100.0)
Platelets: 402 10*3/uL — ABNORMAL HIGH (ref 150–400)
RBC: 3.43 MIL/uL — ABNORMAL LOW (ref 4.22–5.81)
RDW: 14.9 % (ref 11.5–15.5)
WBC: 13.1 10*3/uL — ABNORMAL HIGH (ref 4.0–10.5)
nRBC: 0 % (ref 0.0–0.2)

## 2021-04-23 LAB — BASIC METABOLIC PANEL
Anion gap: 5 (ref 5–15)
BUN: 19 mg/dL (ref 6–20)
CO2: 23 mmol/L (ref 22–32)
Calcium: 8.5 mg/dL — ABNORMAL LOW (ref 8.9–10.3)
Chloride: 110 mmol/L (ref 98–111)
Creatinine, Ser: 1.09 mg/dL (ref 0.61–1.24)
GFR, Estimated: >60 mL/min (ref >60)
Glucose, Bld: 93 mg/dL (ref 70–99)
Potassium: 3.4 mmol/L — ABNORMAL LOW (ref 3.5–5.1)
Sodium: 138 mmol/L (ref 135–145)

## 2021-04-23 SURGERY — COLONOSCOPY
Anesthesia: Monitor Anesthesia Care

## 2021-04-23 MED ORDER — PROPOFOL 500 MG/50ML IV EMUL
INTRAVENOUS | Status: DC | PRN
  Administered 2021-04-23: 140 ug/kg/min via INTRAVENOUS

## 2021-04-23 MED ORDER — ALPRAZOLAM 0.5 MG PO TABS: 1.0000 mg | ORAL_TABLET | Freq: Once | ORAL | Status: DC | PRN

## 2021-04-23 MED ORDER — MORPHINE SULFATE (PF) 2 MG/ML IV SOLN
2.0000 mg | INTRAVENOUS | Status: DC | PRN
  Administered 2021-04-23 – 2021-04-24 (×2): 2 mg via INTRAVENOUS
  Filled 2021-04-23 (×2): qty 1

## 2021-04-23 MED ORDER — PROPOFOL 10 MG/ML IV BOLUS
INTRAVENOUS | Status: DC | PRN
  Administered 2021-04-23: 70 mg via INTRAVENOUS

## 2021-04-23 MED ORDER — SODIUM CHLORIDE 0.9 % IV SOLN: INTRAVENOUS | Status: DC

## 2021-04-23 MED ORDER — MIDAZOLAM HCL 2 MG/2ML IJ SOLN
INTRAMUSCULAR | Status: AC
  Filled 2021-04-23: qty 2

## 2021-04-23 MED ORDER — PROCHLORPERAZINE EDISYLATE 10 MG/2ML IJ SOLN
10.0000 mg | Freq: Four times a day (QID) | INTRAMUSCULAR | Status: DC | PRN
  Administered 2021-04-23: 10 mg via INTRAVENOUS
  Filled 2021-04-23: qty 2

## 2021-04-23 MED ORDER — MORPHINE SULFATE (PF) 2 MG/ML IV SOLN
2.0000 mg | INTRAVENOUS | Status: AC | PRN
  Administered 2021-04-23 (×2): 2 mg via INTRAVENOUS
  Filled 2021-04-23 (×2): qty 1

## 2021-04-23 MED ORDER — PROPOFOL 500 MG/50ML IV EMUL
INTRAVENOUS | Status: AC
  Filled 2021-04-23: qty 50

## 2021-04-23 MED ORDER — DEXMEDETOMIDINE HCL 200 MCG/2ML IV SOLN
INTRAVENOUS | Status: DC | PRN
  Administered 2021-04-23: 12 ug via INTRAVENOUS
  Administered 2021-04-23: 8 ug via INTRAVENOUS

## 2021-04-23 MED ORDER — PROPOFOL 10 MG/ML IV BOLUS
INTRAVENOUS | Status: AC
  Filled 2021-04-23: qty 20

## 2021-04-23 MED ORDER — OXYCODONE HCL 5 MG PO TABS: 5.0000 mg | ORAL_TABLET | ORAL | Status: DC | PRN

## 2021-04-23 MED ORDER — OXYCODONE HCL 5 MG PO TABS
5.0000 mg | ORAL_TABLET | ORAL | Status: DC | PRN
  Administered 2021-04-23 – 2021-04-24 (×2): 5 mg via ORAL
  Filled 2021-04-23 (×2): qty 1

## 2021-04-23 MED ORDER — FENTANYL CITRATE (PF) 100 MCG/2ML IJ SOLN: 25.0000 ug | INTRAMUSCULAR | Status: DC | PRN

## 2021-04-23 MED ORDER — MIDAZOLAM HCL 5 MG/5ML IJ SOLN
INTRAMUSCULAR | Status: DC | PRN
  Administered 2021-04-23 (×2): 1 mg via INTRAVENOUS

## 2021-04-23 MED ORDER — ONDANSETRON HCL 4 MG/2ML IJ SOLN: 4.0000 mg | Freq: Once | INTRAMUSCULAR | Status: DC | PRN

## 2021-04-23 MED ORDER — DEXMEDETOMIDINE (PRECEDEX) IN NS 20 MCG/5ML (4 MCG/ML) IV SYRINGE
PREFILLED_SYRINGE | INTRAVENOUS | Status: AC
  Filled 2021-04-23: qty 5

## 2021-04-23 MED ORDER — PREDNISONE 10 MG PO TABS
40.0000 mg | ORAL_TABLET | Freq: Every day | ORAL | Status: DC
  Administered 2021-04-23 – 2021-04-24 (×2): 40 mg via ORAL
  Filled 2021-04-23 (×2): qty 4

## 2021-04-23 MED ORDER — HYDROCODONE-ACETAMINOPHEN 7.5-325 MG PO TABS
1.0000 | ORAL_TABLET | Freq: Once | ORAL | Status: DC | PRN
  Filled 2021-04-23: qty 1

## 2021-04-23 NOTE — Transfer of Care (Signed)
Immediate Anesthesia Transfer of Care Note  Patient: William Vaughan  Procedure(s) Performed: COLONOSCOPY  Patient Location: PACU  Anesthesia Type:General  Level of Consciousness: awake, alert  and oriented  Airway & Oxygen Therapy: Patient Spontanous Breathing  Post-op Assessment: Report given to RN and Post -op Vital signs reviewed and stable  Post vital signs: Reviewed and stable  Last Vitals:  Vitals Value Taken Time  BP 86/53 04/23/21 1221  Temp 35.6 C 04/23/21 1220  Pulse 80 04/23/21 1221  Resp 18 04/23/21 1221  SpO2 99 % 04/23/21 1221  Vitals shown include unvalidated device data.  Last Pain:  Vitals:   04/23/21 1220  TempSrc: Temporal  PainSc: Asleep         Complications: No notable events documented.

## 2021-04-23 NOTE — Progress Notes (Signed)
Patient could not finish bowel prep and his stool is quite bloody.  Dr. Servando Snare notified.  Will continue to monitor.  William Vaughan

## 2021-04-23 NOTE — Progress Notes (Signed)
1       [place] at [facility]   PATIENT NAME: William Vaughan    MR#:  [id number]  PCP: [facility]  DATE OF BIRTH:  08-28-1986  SUBJECTIVE:  CHIEF COMPLAINT:   Chief Complaint  Patient presents with  . Rectal Bleeding  Reports bloody bowel movement all night with bowel prep, tired REVIEW OF SYSTEMS:  Review of Systems  Constitutional:  Negative for diaphoresis, fever, malaise/fatigue and weight loss.  HENT:  Negative for ear discharge, ear pain, hearing loss, nosebleeds, sore throat and tinnitus.   Eyes:  Negative for blurred vision and pain.  Respiratory:  Negative for cough, hemoptysis, shortness of breath and wheezing.   Cardiovascular:  Negative for chest pain, palpitations, orthopnea and leg swelling.  Gastrointestinal:  Positive for blood in stool. Negative for abdominal pain, constipation, diarrhea, heartburn, nausea and vomiting.  Genitourinary:  Negative for dysuria, frequency and urgency.  Musculoskeletal:  Negative for back pain and myalgias.  Skin:  Negative for itching and rash.  Neurological:  Negative for dizziness, tingling, tremors, focal weakness, seizures, weakness and headaches.  Psychiatric/Behavioral:  Negative for depression. The patient is not nervous/anxious.   DRUG ALLERGIES:   Allergies  Allergen Reactions  . Ambien [Zolpidem Tartrate]     Violent flashbacks  . Food [Peanut-Containing Drug Products] Other (See Comments)    Peanuts Makes crohn's worse/act up  . Diltiazem Rash   VITALS:  Blood pressure 112/66, pulse 67, temperature (!) 96 F (35.6 C), temperature source Temporal, resp. rate 14, height 5\' 11"  (1.803 m), weight 82.6 kg, SpO2 100 %. PHYSICAL EXAMINATION:  Physical Exam 35 year old male lying in the bed in pain Eyes pupil equal round reactive to light and accommodation no scleral icterus extraocular muscles intact Lungs clear to auscultation bilaterally no wheezing rales rhonchi crepitation Cardiovascular  S1-S2 normal no murmurs or gallop Abdomen soft, generalized tenderness, no distention Neuro awake and alert, nonfocal Skin no rash or lesion Extremities no pedal edema cyanosis or clubbing LABORATORY PANEL:  Male CBC Recent Labs  Lab 04/23/21 0549  WBC 13.1*  HGB 9.3*  HCT 28.1*  PLT 402*   ------------------------------------------------------------------------------------------------------------------ Chemistries  Recent Labs  Lab 04/21/21 1810 04/22/21 0711 04/23/21 0549  NA 140   < > 138  K 3.9   < > 3.4*  CL 112*   < > 110  CO2 22   < > 23  GLUCOSE 108*   < > 93  BUN 21*   < > 19  CREATININE 1.27*   < > 1.09  CALCIUM 9.0   < > 8.5*  AST 21  --   --   ALT 17  --   --   ALKPHOS 116  --   --   BILITOT 0.3  --   --    < > = values in this interval not displayed.   RADIOLOGY:  No results found. ASSESSMENT AND PLAN:  35 year old male with history of Crohn's disease, PTSD, anxiety and depression who usually gets his care at the [place]  an episode of rectal bleeding on the day of arrival followed by severe lower abdominal pain typical for his Crohn's flare.    Principal Problem:   Crohn's colitis (HCC) Active Problems:   PTSD (post-traumatic stress disorder)   Hematochezia    Crohn's colitis   Hematochezia -CT abdomen and pelvis with pancolitis.  No bowel obstruction, perforation or signs of ischemia -Colonoscopy today shows severe Crohn's disease with colonic involvement. -Prednisone  40 mg p.o. daily.  Resume diet as tolerated  Hypokalemia Replete and recheck     PTSD (post-traumatic stress disorder)   Body mass index is 25.38 kg/m.  Net IO Since Admission: 2,143.17 mL [04/23/21 1400]      Status is: Inpatient  Remains inpatient appropriate because:Ongoing active pain requiring inpatient pain management  Dispo: The patient is from: Home              Anticipated d/c is to: Home              Patient currently is not medically stable to d/c.    Difficult to place patient No  DVT prophylaxis:       SCDs Start: 04/22/21 0334     Family Communication:  "discussed with patient"   All the records are reviewed and case discussed with Care Management/Social Worker. Management plans discussed with the patient, nursing and they are in agreement.  CODE STATUS: Full Code Level of care: Med-Surg  TOTAL TIME TAKING CARE OF THIS PATIENT: 35 minutes.   More than 50% of the time was spent in counseling/coordination of care: YES  POSSIBLE D/C IN 1-2 DAYS, DEPENDING ON CLINICAL CONDITION.   Delfino Lovett M.D on 04/23/2021 at 2:00 PM  [facility]   CC: Primary care physician; [facility]  Note: This dictation was prepared with Dragon dictation along with smaller phrase technology. Any transcriptional errors that result from this process are unintentional.

## 2021-04-23 NOTE — Op Note (Signed)
[facility] Gastroenterology Patient Name: William Vaughan Procedure Date: 04/23/2021 12:02 PM MRN: [id number] Account #: [id number] Date of Birth: [birth year] Admit Type: Inpatient Age: 35 Room: [facility] ENDO ROOM 3 Gender: Male Note Status: Finalized Procedure:             Colonoscopy Indications:           Hematochezia with a history of crohns disease Providers:             Midge Minium MD, MD Medicines:             Propofol per Anesthesia Complications:         No immediate complications. Procedure:             Pre-Anesthesia Assessment:                        - Prior to the procedure, a History and Physical was                         performed, and patient medications and allergies were                         reviewed. The patient's tolerance of previous                         anesthesia was also reviewed. The risks and benefits                         of the procedure and the sedation options and risks                         were discussed with the patient. All questions were                         answered, and informed consent was obtained. Prior                         Anticoagulants: The patient has taken no previous                         anticoagulant or antiplatelet agents. ASA Grade                         Assessment: II - A patient with mild systemic disease.                         After reviewing the risks and benefits, the patient                         was deemed in satisfactory condition to undergo the                         procedure.                        After obtaining informed consent, the colonoscope was                         passed under direct vision. Throughout the procedure,  the patient's blood pressure, pulse, and oxygen                         saturations were monitored continuously. The                         Colonoscope was introduced through the anus and                         advanced to the the cecum,  identified by appendiceal                         orifice and ileocecal valve. The colonoscopy was                         performed without difficulty. The patient tolerated                         the procedure well. The quality of the bowel                         preparation was good. Findings:      The perianal and digital rectal examinations were normal.      Inflammation was found. This was severe. Biopsies were taken with a cold       forceps for histology. Impression:            - Crohn's disease with colonic involvement.                         Inflammation was found. This was severe. Biopsied. Recommendation:        - Return patient to hospital ward for ongoing care.                        - Resume previous diet.                        - Continue present medications.                        - Use prednisone 40 mg PO once a day. Procedure Code(s):     --- Professional ---                        [phone], Colonoscopy, flexible; with biopsy, single or                         multiple Diagnosis Code(s):     --- Professional ---                        K92.1, Melena (includes Hematochezia)                        K50.10, Crohn's disease of large intestine without                         complications CPT copyright 2019 American Medical Association. All rights reserved. The codes documented in this report are preliminary and upon coder review may  be revised to meet current compliance requirements. Midge Minium MD,  MD 04/23/2021 12:22:47 PM This report has been signed electronically. Number of Addenda: 0 Note Initiated On: 04/23/2021 12:02 PM Scope Withdrawal Time: 0 hours 3 minutes 15 seconds  Total Procedure Duration: 0 hours 9 minutes 23 seconds  Estimated Blood Loss:  Estimated blood loss: none.      [facility]

## 2021-04-23 NOTE — Anesthesia Preprocedure Evaluation (Addendum)
Anesthesia Evaluation  Patient identified by MRN, date of birth, ID band Patient awake    Airway Mallampati: II  TM Distance: >3 FB Neck ROM: Full    Dental  (+) Edentulous Upper, Edentulous Lower   Pulmonary Current Smoker,           Cardiovascular   11/12/20 ECHO: 1. Left ventricular ejection fraction, by estimation, is 60 to 65%. The left ventricle has normal function. The left ventricle has no regional wall motion abnormalities. Left ventricular diastolic parameters were normal. 2. Right ventricular systolic function is normal. The right ventricular size is normal.   Neuro/Psych Seizures -,  PSYCHIATRIC DISORDERS Anxiety Depression TBI    GI/Hepatic Crohn's disease pancolitis Rectal bleeding   Endo/Other    Renal/GU Renal disease     Musculoskeletal   Abdominal   Peds  Hematology  (+) anemia ,   Anesthesia Other Findings   Reproductive/Obstetrics                            Anesthesia Physical Anesthesia Plan  ASA: 3  Anesthesia Plan: MAC   Post-op Pain Management:    Induction: Intravenous  PONV Risk Score and Plan:   Airway Management Planned: Nasal Cannula  Additional Equipment:   Intra-op Plan:   Post-operative Plan:   Informed Consent: I have reviewed the patients History and Physical, chart, labs and discussed the procedure including the risks, benefits and alternatives for the proposed anesthesia with the patient or authorized representative who has indicated his/her understanding and acceptance.     Dental advisory given  Plan Discussed with:   Anesthesia Plan Comments: (Patient states wakes up violently from anesthesia)       Anesthesia Quick Evaluation

## 2021-04-23 NOTE — Anesthesia Postprocedure Evaluation (Signed)
Anesthesia Post Note  Patient: William Vaughan  Procedure(s) Performed: COLONOSCOPY  Anesthesia Type: MAC Anesthetic complications: no   No notable events documented.   Last Vitals:  Vitals:   04/23/21 1244 04/23/21 1250  BP: (!) 90/44 112/66  Pulse: 67   Resp: 14   Temp:    SpO2: 100%     Last Pain:  Vitals:   04/23/21 1250  TempSrc:   PainSc: 0-No pain                 Deno Etienne

## 2021-04-23 NOTE — Plan of Care (Signed)
Continuing with plan of care. 

## 2021-04-24 LAB — TYPE AND SCREEN
ABO/RH(D): A POS
Antibody Screen: POSITIVE
PT AG Type: POSITIVE
Unit division: 0
Unit division: 0

## 2021-04-24 LAB — BPAM RBC
Blood Product Expiration Date: 202207212359
Blood Product Expiration Date: 202207212359
Unit Type and Rh: 6200
Unit Type and Rh: 6200

## 2021-04-24 LAB — BASIC METABOLIC PANEL
Anion gap: 3 — ABNORMAL LOW (ref 5–15)
BUN: 13 mg/dL (ref 6–20)
CO2: 24 mmol/L (ref 22–32)
Calcium: 8.6 mg/dL — ABNORMAL LOW (ref 8.9–10.3)
Chloride: 112 mmol/L — ABNORMAL HIGH (ref 98–111)
Creatinine, Ser: 1.01 mg/dL (ref 0.61–1.24)
GFR, Estimated: >60 mL/min (ref >60)
Glucose, Bld: 115 mg/dL — ABNORMAL HIGH (ref 70–99)
Potassium: 4.3 mmol/L (ref 3.5–5.1)
Sodium: 139 mmol/L (ref 135–145)

## 2021-04-24 LAB — CBC
HCT: 31.5 % — ABNORMAL LOW (ref 39.0–52.0)
Hemoglobin: 10.2 g/dL — ABNORMAL LOW (ref 13.0–17.0)
MCH: 26.8 pg (ref 26.0–34.0)
MCHC: 32.4 g/dL (ref 30.0–36.0)
MCV: 82.7 fL (ref 80.0–100.0)
Platelets: 416 10*3/uL — ABNORMAL HIGH (ref 150–400)
RBC: 3.81 MIL/uL — ABNORMAL LOW (ref 4.22–5.81)
RDW: 14.7 % (ref 11.5–15.5)
WBC: 7.5 10*3/uL (ref 4.0–10.5)
nRBC: 0 % (ref 0.0–0.2)

## 2021-04-24 MED ORDER — PREDNISONE 20 MG PO TABS: 40.0000 mg | ORAL_TABLET | Freq: Every day | ORAL | 0 refills | Status: AC

## 2021-04-24 MED ORDER — OXYCODONE HCL 5 MG PO TABS: 5.0000 mg | ORAL_TABLET | Freq: Three times a day (TID) | ORAL | 0 refills | Status: AC | PRN

## 2021-04-24 NOTE — Progress Notes (Signed)
Midge Minium, MD [facility]   [address], [address] [place], [place] [address] Phone: [phone] Fax : [phone]   Subjective: Patient reports that he is doing better today and has had no further rectal bleeding.  The patient had a colonoscopy yesterday with diffuse colitis.  The patient had a large amount of blood in his colon.  He was started on steroids yesterday.  The patient has been told the results of his procedure yesterday.   Objective: Vital signs in last 24 hours: Vitals:   04/23/21 1951 04/23/21 2306 04/24/21 0456 04/24/21 0822  BP: 123/70  113/63 111/69  Pulse: 86  71 69  Resp: 16  14 18   Temp: 98.9 F (37.2 C) 98.7 F (37.1 C) 97.9 F (36.6 C) 98.1 F (36.7 C)  TempSrc: Oral Oral Oral Oral  SpO2: 100%  98% 100%  Weight:      Height:       Weight change:   Intake/Output Summary (Last 24 hours) at 04/24/2021 0959 Last data filed at 04/24/2021 0941 Gross per 24 hour  Intake 856.83 ml  Output 1100 ml  Net -243.17 ml     Exam: General: Sitting up in bed in no apparent distress   Lab Results: @LABTEST2 @ Micro Results: Recent Results (from the past 240 hour(s))  Gastrointestinal Panel by PCR , Stool     Status: None   Collection Time: 04/21/21  8:13 PM   Specimen: Stool  Result Value Ref Range Status   Campylobacter species NOT DETECTED NOT DETECTED Final   Plesimonas shigelloides NOT DETECTED NOT DETECTED Final   Salmonella species NOT DETECTED NOT DETECTED Final   Yersinia enterocolitica NOT DETECTED NOT DETECTED Final   Vibrio species NOT DETECTED NOT DETECTED Final   Vibrio cholerae NOT DETECTED NOT DETECTED Final   Enteroaggregative E coli (EAEC) NOT DETECTED NOT DETECTED Final   Enteropathogenic E coli (EPEC) NOT DETECTED NOT DETECTED Final   Enterotoxigenic E coli (ETEC) NOT DETECTED NOT DETECTED Final   Shiga like toxin producing E coli (STEC) NOT DETECTED NOT DETECTED Final   Shigella/Enteroinvasive E coli (EIEC) NOT DETECTED NOT DETECTED  Final   Cryptosporidium NOT DETECTED NOT DETECTED Final   Cyclospora cayetanensis NOT DETECTED NOT DETECTED Final   Entamoeba histolytica NOT DETECTED NOT DETECTED Final   Giardia lamblia NOT DETECTED NOT DETECTED Final   Adenovirus F40/41 NOT DETECTED NOT DETECTED Final   Astrovirus NOT DETECTED NOT DETECTED Final   Norovirus GI/GII NOT DETECTED NOT DETECTED Final   Rotavirus A NOT DETECTED NOT DETECTED Final   Sapovirus (I, II, IV, and V) NOT DETECTED NOT DETECTED Final    Comment: Performed at [facility], [address] [place], [address] [place]  C Difficile Quick Screen w PCR reflex     Status: None   Collection Time: 04/21/21  8:13 PM   Specimen: STOOL  Result Value Ref Range Status   C Diff antigen NEGATIVE NEGATIVE Final   C Diff toxin NEGATIVE NEGATIVE Final   C Diff interpretation No C. difficile detected.  Final    Comment: Performed at [facility], [address] [place], [address] [place]  Resp Panel by RT-PCR (Flu A&B, Covid) Nasopharyngeal Swab     Status: None   Collection Time: 04/22/21  2:00 AM   Specimen: Nasopharyngeal Swab; Nasopharyngeal(NP) swabs in vial transport medium  Result Value Ref Range Status   SARS Coronavirus 2 by RT PCR NEGATIVE NEGATIVE Final    Comment: (NOTE) SARS-CoV-2 target nucleic  acids are NOT DETECTED.  The SARS-CoV-2 RNA is generally detectable in upper respiratory specimens during the acute phase of infection. The lowest concentration of SARS-CoV-2 viral copies this assay can detect is 138 copies/mL. A negative result does not preclude SARS-Cov-2 infection and should not be used as the sole basis for treatment or other patient management decisions. A negative result may occur with  improper specimen collection/handling, submission of specimen other than nasopharyngeal swab, presence of viral mutation(s) within the areas targeted by this assay, and inadequate number of viral copies(<138 copies/mL). A negative  result must be combined with clinical observations, patient history, and epidemiological information. The expected result is Negative.  Fact Sheet for Patients:  BloggerCourse.com  Fact Sheet for Healthcare Providers:  SeriousBroker.it  This test is no t yet approved or cleared by the [place] FDA and  has been authorized for detection and/or diagnosis of SARS-CoV-2 by FDA under an Emergency Use Authorization (EUA). This EUA will remain  in effect (meaning this test can be used) for the duration of the COVID-19 declaration under Section 564(b)(1) of the Act, 21 U.S.C.section 360bbb-3(b)(1), unless the authorization is terminated  or revoked sooner.       Influenza A by PCR NEGATIVE NEGATIVE Final   Influenza B by PCR NEGATIVE NEGATIVE Final    Comment: (NOTE) The Xpert Xpress SARS-CoV-2/FLU/RSV plus assay is intended as an aid in the diagnosis of influenza from Nasopharyngeal swab specimens and should not be used as a sole basis for treatment. Nasal washings and aspirates are unacceptable for Xpert Xpress SARS-CoV-2/FLU/RSV testing.  Fact Sheet for Patients: BloggerCourse.com  Fact Sheet for Healthcare Providers: SeriousBroker.it  This test is not yet approved or cleared by the [place] FDA and has been authorized for detection and/or diagnosis of SARS-CoV-2 by FDA under an Emergency Use Authorization (EUA). This EUA will remain in effect (meaning this test can be used) for the duration of the COVID-19 declaration under Section 564(b)(1) of the Act, 21 U.S.C. section 360bbb-3(b)(1), unless the authorization is terminated or revoked.  Performed at [facility], [address], [place], [place] [address]    Studies/Results: No results found. Medications: I have reviewed the patient's current medications. Scheduled Meds:  predniSONE  40 mg Oral QAC  breakfast   Continuous Infusions:  sodium chloride Stopped (04/24/21 0723)   PRN Meds:.acetaminophen **OR** acetaminophen, morphine injection, ondansetron **OR** ondansetron (ZOFRAN) IV, oxyCODONE, prochlorperazine   Assessment: Principal Problem:   Crohn's colitis (HCC) Active Problems:   PTSD (post-traumatic stress disorder)   Hematochezia    Plan: This patient has what appears to be a flare of his Crohn's disease.  The patient is on his Stelara and has now been started on steroids.  The patient should follow-up with his primary gastroenterologist as soon as possible or make an appoint with our office and preferably with Dr. Allegra Lai for his inflammatory bowel disease.  The patient has been explained the plan agrees with it nothing further to do from a GI point of view.   LOS: 2 days   Sherlyn Hay 04/24/2021, 9:59 AM Pager [phone] 7am-5pm  Check AMION for 5pm -7am coverage and on weekends

## 2021-04-24 NOTE — Discharge Summary (Signed)
5       [place] [facility]   PATIENT NAME: William Vaughan    MR#:  [id number]  DATE OF BIRTH:  [birth year]  DATE OF ADMISSION:  04/21/2021   ADMITTING PHYSICIAN: Andris Baumann, MD  DATE OF DISCHARGE: 04/24/2021  2:38 PM  PRIMARY CARE PHYSICIAN: [facility]   ADMISSION DIAGNOSIS:  Rectal bleeding [K62.5] Crohn's colitis (HCC) [K50.10] Crohn's disease of colon with complication (HCC) [K50.119] DISCHARGE DIAGNOSIS:  Principal Problem:   Crohn's colitis (HCC) Active Problems:   PTSD (post-traumatic stress disorder)   Hematochezia  SECONDARY DIAGNOSIS:   Past Medical History:  Diagnosis Date  . Crohn's disease (HCC) 12/2008  . PTSD (post-traumatic stress disorder)   . Seizures (HCC)   . Sleep apnea   . Traumatic brain injury [facility])    HOSPITAL COURSE:  35 year old male with history of Crohn's disease, PTSD, anxiety and depression who usually gets his care at the [place]  an episode of rectal bleeding on the day of arrival followed by severe lower abdominal pain typical for his Crohn's flare.       Crohn's colitis   Hematochezia -CT abdomen and pelvis with pancolitis.  No bowel obstruction, perforation or signs of ischemia -Colonoscopy on 7/9 showed severe Crohn's disease with colonic involvement.  Patient tolerating diet without any further bleeding.  He does have some lingering abdominal pain for which he is requesting some narcotic for breakthrough pain at discharge which I have prescribed total 15 tablets of oxycodone. -Treated with IV steroids while in the hospital and being discharged on prednisone 40 mg p.o. daily.  He is asked to take oral prednisone as prescribed for next 2 weeks and then taper 10 mg weekly till finished. -He is requested to follow-up with his primary GI as soon as possible at [place].  Alternatively he can follow-up with Dr. Allegra Lai locally.  Patient is in agreement   Hypokalemia Repleted and resolved     PTSD (post-traumatic stress  disorder)  DISCHARGE CONDITIONS:  Stable CONSULTS OBTAINED:   DRUG ALLERGIES:   Allergies  Allergen Reactions  . Ambien [Zolpidem Tartrate]     Violent flashbacks  . Food [Peanut-Containing Drug Products] Other (See Comments)    Peanuts Makes crohn's worse/act up  . Diltiazem Rash   DISCHARGE MEDICATIONS:   Allergies as of 04/24/2021       Reactions   Ambien [zolpidem Tartrate]    Violent flashbacks   Food [peanut-containing Drug Products] Other (See Comments)   Peanuts Makes crohn's worse/act up   Diltiazem Rash        Medication List     STOP taking these medications    QUEtiapine 200 MG tablet Commonly known as: SEROQUEL       TAKE these medications    BELSOMRA PO Take by mouth at bedtime.   cyanocobalamin 1000 MCG tablet Take 1,000 mcg by mouth daily.   ferrous sulfate 325 (65 FE) MG EC tablet Take 1 tablet (325 mg total) by mouth 2 (two) times daily. What changed: when to take this   folic acid 1 MG tablet Commonly known as: FOLVITE Take 1 tablet (1 mg total) by mouth daily.   hydrOXYzine 25 MG capsule Commonly known as: VISTARIL Take 1 capsule (25 mg total) by mouth 3 (three) times daily as needed.   multivitamin with minerals Tabs tablet Take 1 tablet by mouth daily.   nortriptyline 50 MG capsule Commonly known as: PAMELOR Take 50 mg by mouth at  bedtime.   oxyCODONE 5 MG immediate release tablet Commonly known as: Oxy IR/ROXICODONE Take 1 tablet (5 mg total) by mouth every 8 (eight) hours as needed for up to 5 days for breakthrough pain.   predniSONE 20 MG tablet Commonly known as: DELTASONE Take 2 tablets (40 mg total) by mouth daily with breakfast. Take 40 mg po daily for 2 weeks, 10 mg per week taper till finish   topiramate 15 MG capsule Commonly known as: TOPAMAX Take 60 mg by mouth at bedtime.   ustekinumab 90 MG/ML Sosy injection Commonly known as: STELARA Inject 90 mg into the skin. Every 8 weeks   venlafaxine 50 MG  tablet Commonly known as: EFFEXOR Take 50 mg by mouth daily.       DISCHARGE INSTRUCTIONS:   DIET:  Cardiac diet DISCHARGE CONDITION:  Stable ACTIVITY:  Activity as tolerated OXYGEN:  Home Oxygen: No.  Oxygen Delivery: room air DISCHARGE LOCATION:  home   If you experience worsening of your admission symptoms, develop shortness of breath, life threatening emergency, suicidal or homicidal thoughts you must seek medical attention immediately by calling 911 or calling your MD immediately  if symptoms less severe.  You Must read complete instructions/literature along with all the possible adverse reactions/side effects for all the Medicines you take and that have been prescribed to you. Take any new Medicines after you have completely understood and accpet all the possible adverse reactions/side effects.   Please note  You were cared for by a hospitalist during your hospital stay. If you have any questions about your discharge medications or the care you received while you were in the hospital after you are discharged, you can call the unit and asked to speak with the hospitalist on call if the hospitalist that took care of you is not available. Once you are discharged, your primary care physician will handle any further medical issues. Please note that NO REFILLS for any discharge medications will be authorized once you are discharged, as it is imperative that you return to your primary care physician (or establish a relationship with a primary care physician if you do not have one) for your aftercare needs so that they can reassess your need for medications and monitor your lab values.    On the day of Discharge:  VITAL SIGNS:  Blood pressure 111/69, pulse 69, temperature 98.1 F (36.7 C), temperature source Oral, resp. rate 18, height 5\' 11"  (1.803 m), weight 82.6 kg, SpO2 100 %. PHYSICAL EXAMINATION:  GENERAL:  35 y.o.-year-old patient lying in the bed with no acute distress.   EYES: Pupils equal, round, reactive to light and accommodation. No scleral icterus. Extraocular muscles intact.  HEENT: Head atraumatic, normocephalic. Oropharynx and nasopharynx clear.  NECK:  Supple, no jugular venous distention. No thyroid enlargement, no tenderness.  LUNGS: Normal breath sounds bilaterally, no wheezing, rales,rhonchi or crepitation. No use of accessory muscles of respiration.  CARDIOVASCULAR: S1, S2 normal. No murmurs, rubs, or gallops.  ABDOMEN: Soft, non-tender, non-distended. Bowel sounds present. No organomegaly or mass.  EXTREMITIES: No pedal edema, cyanosis, or clubbing.  NEUROLOGIC: Cranial nerves II through XII are intact. Muscle strength 5/5 in all extremities. Sensation intact. Gait not checked.  PSYCHIATRIC: The patient is alert and oriented x 3.  SKIN: No obvious rash, lesion, or ulcer.  DATA REVIEW:   CBC Recent Labs  Lab 04/24/21 0613  WBC 7.5  HGB 10.2*  HCT 31.5*  PLT 416*    Chemistries  Recent Labs  Lab  04/21/21 1810 04/22/21 0711 04/24/21 0613  NA 140   < > 139  K 3.9   < > 4.3  CL 112*   < > 112*  CO2 22   < > 24  GLUCOSE 108*   < > 115*  BUN 21*   < > 13  CREATININE 1.27*   < > 1.01  CALCIUM 9.0   < > 8.6*  AST 21  --   --   ALT 17  --   --   ALKPHOS 116  --   --   BILITOT 0.3  --   --    < > = values in this interval not displayed.     Outpatient follow-up  Follow-up Information     [facility]. Schedule an appointment as soon as possible for a visit in 1 week(s).   Specialty: General Practice Why: [facility] Discharge F/UP Contact information: [address] [place] [address] [phone]                 30 Day Unplanned Readmission Risk Score    Flowsheet Row ED to Hosp-Admission (Discharged) from 04/21/2021 in [facility]  30 Day Unplanned Readmission Risk Score (%) 21.68 Filed at 04/24/2021 1200       This score is the patient's risk of an unplanned  readmission within 30 days of being discharged (0 -100%). The score is based on dignosis, age, lab data, medications, orders, and past utilization.   Low:  0-14.9   Medium: 15-21.9   High: 22-29.9   Extreme: 30 and above           Management plans discussed with the patient, family and they are in agreement.  CODE STATUS: Full Code   TOTAL TIME TAKING CARE OF THIS PATIENT: 45 minutes.    Delfino Lovett M.D on 04/24/2021 at 3:40 PM  [facility]   CC: Primary care physician; [facility]   Note: This dictation was prepared with Dragon dictation along with smaller phrase technology. Any transcriptional errors that result from this process are unintentional.

## 2021-04-24 NOTE — Plan of Care (Signed)
Continuing with plan of care. 

## 2021-04-24 NOTE — Plan of Care (Signed)
Discharge teaching completed with patient who is in stable condition. 

## 2021-04-25 ENCOUNTER — Encounter: Payer: Self-pay | Admitting: Gastroenterology

## 2021-04-27 LAB — SURGICAL PATHOLOGY

## 2021-09-20 ENCOUNTER — Emergency Department (HOSPITAL_COMMUNITY)
Admission: EM | Admit: 2021-09-20 | Discharge: 2021-09-20 | Disposition: A | Discharge status: Home / Self Care | Payer: No Typology Code available for payment source | Attending: Emergency Medicine | Admitting: Emergency Medicine

## 2021-09-20 ENCOUNTER — Emergency Department (HOSPITAL_COMMUNITY): Payer: No Typology Code available for payment source

## 2021-09-20 DIAGNOSIS — Z79899 Other long term (current) drug therapy: Secondary | ICD-10-CM | POA: Insufficient documentation

## 2021-09-20 DIAGNOSIS — S060X0A Concussion without loss of consciousness, initial encounter: Secondary | ICD-10-CM

## 2021-09-20 DIAGNOSIS — Z91010 Allergy to peanuts: Secondary | ICD-10-CM | POA: Insufficient documentation

## 2021-09-20 DIAGNOSIS — S0003XA Contusion of scalp, initial encounter: Secondary | ICD-10-CM | POA: Insufficient documentation

## 2021-09-20 DIAGNOSIS — F17290 Nicotine dependence, other tobacco product, uncomplicated: Secondary | ICD-10-CM | POA: Insufficient documentation

## 2021-09-20 DIAGNOSIS — W01198A Fall on same level from slipping, tripping and stumbling with subsequent striking against other object, initial encounter: Secondary | ICD-10-CM | POA: Diagnosis not present

## 2021-09-20 DIAGNOSIS — S0990XA Unspecified injury of head, initial encounter: Secondary | ICD-10-CM | POA: Diagnosis present

## 2021-09-20 MED ORDER — ACETAMINOPHEN 325 MG PO TABS
650.0000 mg | ORAL_TABLET | Freq: Once | ORAL | Status: AC
  Administered 2021-09-20: 650 mg via ORAL
  Filled 2021-09-20: qty 2

## 2021-09-20 NOTE — Discharge Instructions (Signed)
You likely have a concussion.  Your CT head and cervical spine did not show a fracture or bleeding  You may want to follow-up with the concussion clinic at the [place]  Return to ER if you have vomiting, severe headaches, weakness, numbness

## 2021-09-20 NOTE — ED Triage Notes (Signed)
EMS arrival. 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 Head strike midline in between crown and forehead. No LOC. No bleed. No hematoma noted. Arrives in C-collar. Hx of TBI. AxO4. Verbalizes neck pain, noted a 3. Head a 4.

## 2021-09-20 NOTE — ED Provider Notes (Signed)
[facility] EMERGENCY DEPARTMENT Provider Note   CSN: [id number] Arrival date & time: 09/20/21  2052     History Chief Complaint  Patient presents with   Head Injury    William Vaughan is a 35 y.o. male hx of Crohn's disease, PTSD, TBI, here presenting with head injury.  Patient states that he works at [employer].  He states that he was at work and 2 TVs fell from 8 foot high and hit his head.  Patient complains of headache and neck pain.  Patient was brought by EMS.  No other injury was noted.  Patient states that he has history of Crohn's disease and always has abdominal pain but denies any abdominal injury or chest injury  The history is provided by the patient.      Past Medical History:  Diagnosis Date   Crohn's disease (HCC) 12/2008   PTSD (post-traumatic stress disorder)    Seizures (HCC)    Sleep apnea    Traumatic brain injury [facility])     Patient Active Problem List   Diagnosis Date Noted   Crohn's colitis (HCC) 04/22/2021   Hematochezia 04/22/2021   GI bleeding 11/12/2020   Diarrhea    Elevated troponin    GI bleed 11/10/2020   Severe anemia 11/10/2020   AKI (acute kidney injury) (HCC) 11/10/2020   Cannabis use disorder, moderate, dependence (HCC) 08/24/2016   Suicidal ideation 08/23/2016   Chronic pain 08/23/2016   Adjustment disorder with mixed anxiety and depressed mood 06/20/2016   PTSD (post-traumatic stress disorder) 06/20/2016   Involuntary commitment 06/20/2016   Toothache 06/20/2016   Crohn's disease (HCC) 12/14/2008    Past Surgical History:  Procedure Laterality Date   COLONOSCOPY N/A 04/23/2021   Procedure: COLONOSCOPY;  Surgeon: Midge Minium, MD;  Location: [facility] ENDOSCOPY;  Service: Endoscopy;  Laterality: N/A;       No family history on file.  Social History   Tobacco Use   Smoking status: Every Day    Types: Cigars   Smokeless tobacco: Never   Tobacco comments:    cigars  Vaping Use   Vaping Use: Never used   Substance Use Topics   Alcohol use: Yes   Drug use: Not Currently    Home Medications Prior to Admission medications   Medication Sig Start Date End Date Taking? Authorizing Provider  cyanocobalamin 1000 MCG tablet Take 1,000 mcg by mouth daily.    [provider]  ferrous sulfate 325 (65 FE) MG EC tablet Take 1 tablet (325 mg total) by mouth 2 (two) times daily. 11/14/20 11/14/21  Arnetha Courser, MD  folic acid (FOLVITE) 1 MG tablet Take 1 tablet (1 mg total) by mouth daily. 11/14/20 11/14/21  Arnetha Courser, MD  hydrOXYzine (VISTARIL) 25 MG capsule Take 1 capsule (25 mg total) by mouth 3 (three) times daily as needed. 11/14/20   Arnetha Courser, MD  Multiple Vitamin (MULTIVITAMIN WITH MINERALS) TABS tablet Take 1 tablet by mouth daily. 11/15/20   Arnetha Courser, MD  nortriptyline (PAMELOR) 50 MG capsule Take 50 mg by mouth at bedtime.    [provider]  predniSONE (DELTASONE) 20 MG tablet Take 2 tablets (40 mg total) by mouth daily with breakfast. Take 40 mg po daily for 2 weeks, 10 mg per week taper till finish 04/24/21   Delfino Lovett, MD  Suvorexant (BELSOMRA PO) Take by mouth at bedtime.    [provider]  topiramate (TOPAMAX) 15 MG capsule Take 60 mg by mouth at  bedtime.    [provider]  ustekinumab (STELARA) 90 MG/ML SOSY injection Inject 90 mg into the skin. Every 8 weeks 12/02/20   [provider]  venlafaxine (EFFEXOR) 50 MG tablet Take 50 mg by mouth daily.    [provider]    Allergies    Ambien [zolpidem tartrate], Food [peanut-containing drug products], and Diltiazem  Review of Systems   Review of Systems  Musculoskeletal:  Positive for neck pain.  Neurological:  Positive for headaches.  All other systems reviewed and are negative.  Physical Exam Updated Vital Signs BP 129/82   Pulse 77   Temp 98.4 F (36.9 C)   Resp 17   SpO2 100%   Physical Exam Vitals and nursing note reviewed.  Constitutional:       Appearance: Normal appearance.  HENT:     Head: Normocephalic.     Comments: Superior scalp hematoma    Nose: Nose normal.     Mouth/Throat:     Mouth: Mucous membranes are moist.  Eyes:     Extraocular Movements: Extraocular movements intact.     Pupils: Pupils are equal, round, and reactive to light.  Neck:     Comments: C-collar in place Cardiovascular:     Rate and Rhythm: Normal rate and regular rhythm.     Pulses: Normal pulses.     Heart sounds: Normal heart sounds.  Pulmonary:     Effort: Pulmonary effort is normal.     Breath sounds: Normal breath sounds.  Abdominal:     General: Abdomen is flat.     Palpations: Abdomen is soft.  Musculoskeletal:        General: Normal range of motion.     Comments: No obvious midline tenderness  Skin:    General: Skin is warm.     Capillary Refill: Capillary refill takes less than 2 seconds.  Neurological:     General: No focal deficit present.     Mental Status: He is alert and oriented to person, place, and time.     Comments: Cranial nerves II to XII is intact.  Patient has normal sensation and strength bilaterally  Psychiatric:        Mood and Affect: Mood normal.        Behavior: Behavior normal.    ED Results / Procedures / Treatments   Labs (all labs ordered are listed, but only abnormal results are displayed) Labs Reviewed - No data to display  EKG None  Radiology CT HEAD WO CONTRAST (5MM)  Result Date: 09/20/2021 CLINICAL DATA:  Blunt trauma to the head and neck, initial encounter EXAM: CT HEAD WITHOUT CONTRAST CT CERVICAL SPINE WITHOUT CONTRAST TECHNIQUE: Multidetector CT imaging of the head and cervical spine was performed following the standard protocol without intravenous contrast. Multiplanar CT image reconstructions of the cervical spine were also generated. COMPARISON:  None. FINDINGS: CT HEAD FINDINGS Brain: No evidence of acute infarction, hemorrhage, hydrocephalus, extra-axial collection or mass lesion/mass  effect. Vascular: No hyperdense vessel or unexpected calcification. Skull: Normal. Negative for fracture or focal lesion. Sinuses/Orbits: No acute finding. Other: None. CT CERVICAL SPINE FINDINGS Alignment: Within normal limits. Skull base and vertebrae: 7 cervical segments are well visualized. Vertebral body height is well maintained. No acute fracture or acute facet abnormality is noted. The odontoid is within normal limits. Soft tissues and spinal canal: Surrounding soft tissue structures are within normal limits. Upper chest: Visualized lung apices are unremarkable. Other: None IMPRESSION: CT of the head: No acute  intracranial abnormality noted. CT of the cervical spine: No acute abnormality noted. Electronically Signed   By: Inez Catalina M.D.   On: 09/20/2021 22:33   CT Cervical Spine Wo Contrast  Result Date: 09/20/2021 CLINICAL DATA:  Blunt trauma to the head and neck, initial encounter EXAM: CT HEAD WITHOUT CONTRAST CT CERVICAL SPINE WITHOUT CONTRAST TECHNIQUE: Multidetector CT imaging of the head and cervical spine was performed following the standard protocol without intravenous contrast. Multiplanar CT image reconstructions of the cervical spine were also generated. COMPARISON:  None. FINDINGS: CT HEAD FINDINGS Brain: No evidence of acute infarction, hemorrhage, hydrocephalus, extra-axial collection or mass lesion/mass effect. Vascular: No hyperdense vessel or unexpected calcification. Skull: Normal. Negative for fracture or focal lesion. Sinuses/Orbits: No acute finding. Other: None. CT CERVICAL SPINE FINDINGS Alignment: Within normal limits. Skull base and vertebrae: 7 cervical segments are well visualized. Vertebral body height is well maintained. No acute fracture or acute facet abnormality is noted. The odontoid is within normal limits. Soft tissues and spinal canal: Surrounding soft tissue structures are within normal limits. Upper chest: Visualized lung apices are unremarkable. Other: None  IMPRESSION: CT of the head: No acute intracranial abnormality noted. CT of the cervical spine: No acute abnormality noted. Electronically Signed   By: Inez Catalina M.D.   On: 09/20/2021 22:33    Procedures Procedures   Medications Ordered in ED Medications  acetaminophen (TYLENOL) tablet 650 mg (650 mg Oral Given 09/20/21 2209)    ED Course  I have reviewed the triage vital signs and the nursing notes.  Pertinent labs & imaging results that were available during my care of the patient were reviewed by me and considered in my medical decision making (see chart for details).    MDM Rules/Calculators/A&P                           William Vaughan is a 35 y.o. male here presenting with head injury.  Patient had 2 TVs fell on his head. Has nonfocal neuro exam. Will get CT head/neck.   10:49 PM CT showed no bleed or fractures.  Patient has a history of traumatic brain injury.  I told him that he likely will be more confused for several days.  Stable for discharge  Final Clinical Impression(s) / ED Diagnoses Final diagnoses:  None    Rx / DC Orders ED Discharge Orders     None        Drenda Freeze, MD 09/20/21 2250

## 2022-03-12 IMAGING — CT CT ANGIO CHEST
2 of 6 series · 18 of 46 positions shown · IV contrast (APPLIED)
Comparison: Chest radiograph November 10, 2020

CLINICAL DATA: Chest pain.

EXAM:
CT ANGIOGRAPHY CHEST WITH CONTRAST
TECHNIQUE: Multidetector CT imaging of the chest was performed using the
standard protocol during bolus administration of intravenous
contrast. Multiplanar CT image reconstructions and MIPs were
obtained to evaluate the vascular anatomy.
CONTRAST:  75mL OMNIPAQUE IOHEXOL 350 MG/ML SOLN

[Series 3: thins · axial · 0.68mm/px · z∈[-535,-301]mm · 15 of 258 slices shown]
[im 12/258  lung]
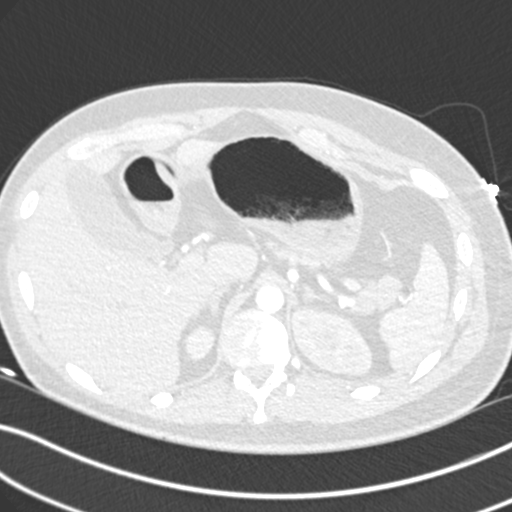
[im 34/258  soft-tissue]
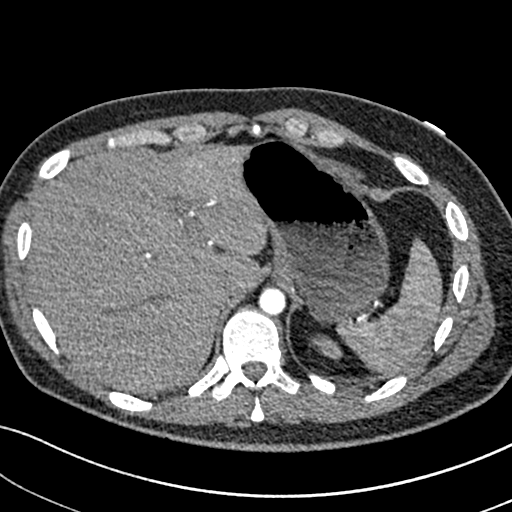
[im 45/258  lung]
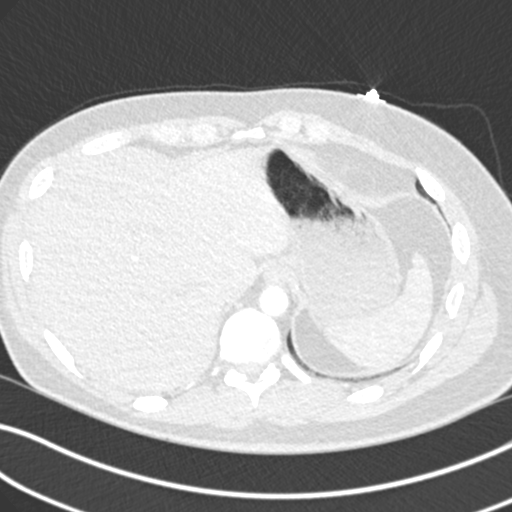
[im 68/258  soft-tissue]
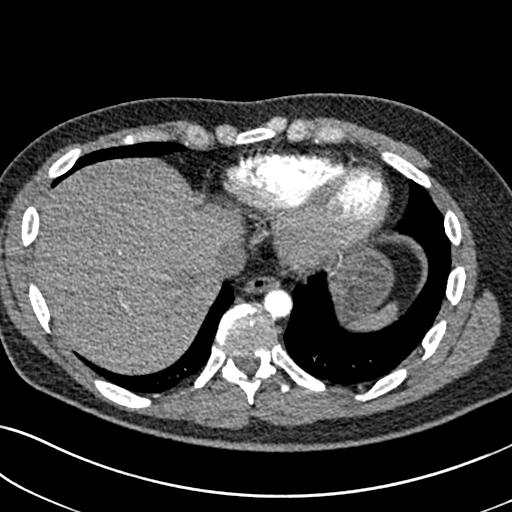
[im 79/258  lung]
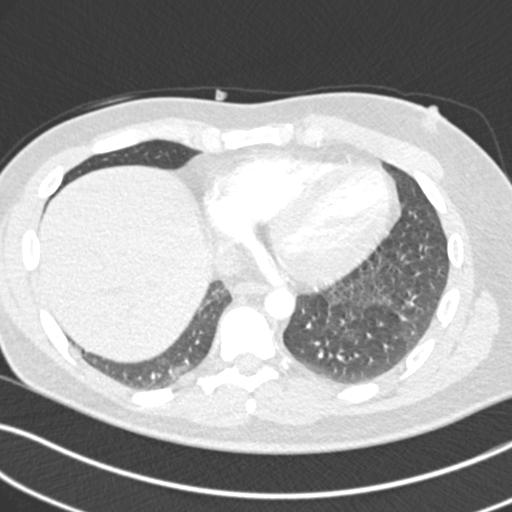
[im 101/258  soft-tissue]
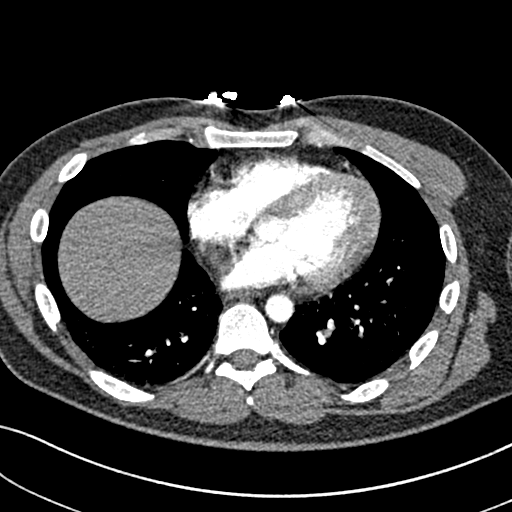
[im 112/258  lung]
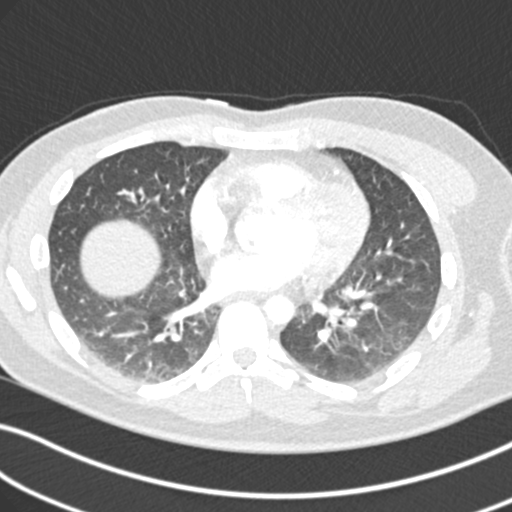
[im 135/258  soft-tissue]
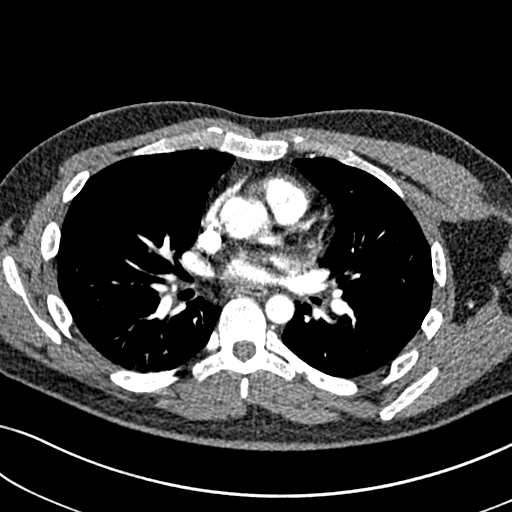
[im 146/258  lung]
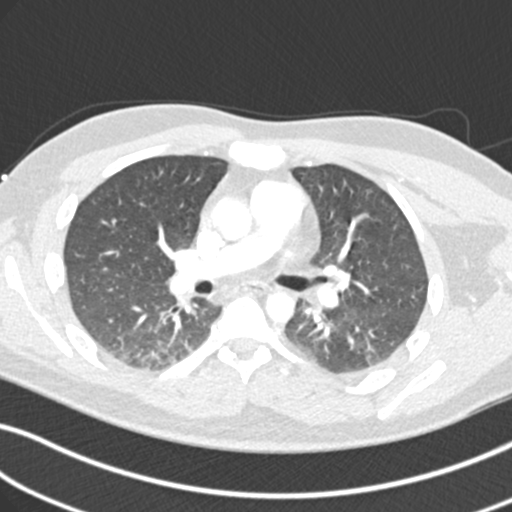
[im 157/258  soft-tissue]
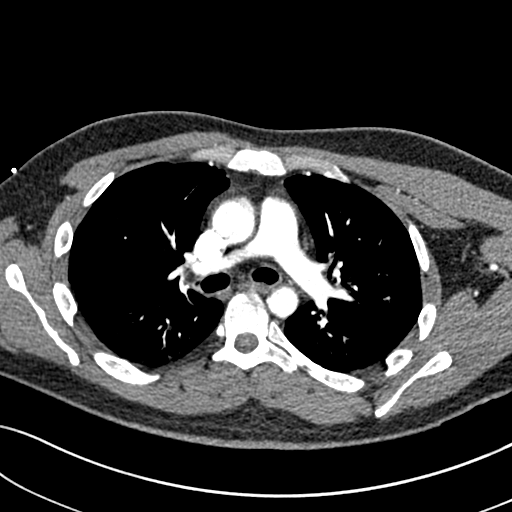
[im 179/258  lung]
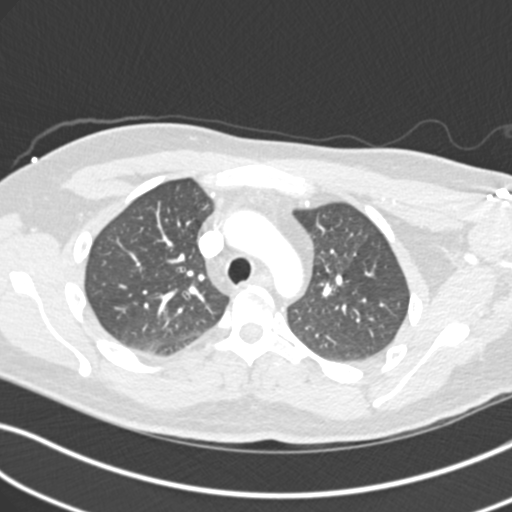
[im 190/258  soft-tissue]
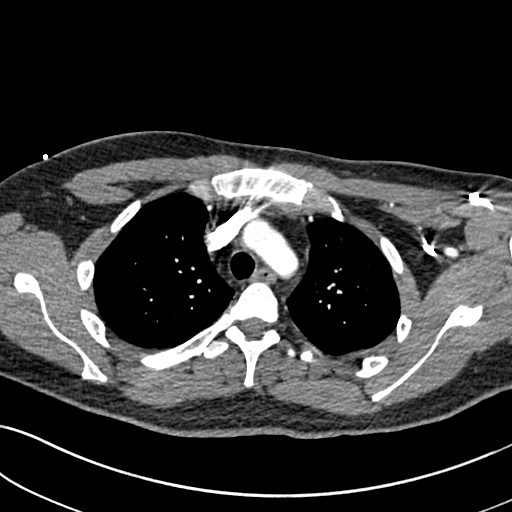
[im 213/258  lung]
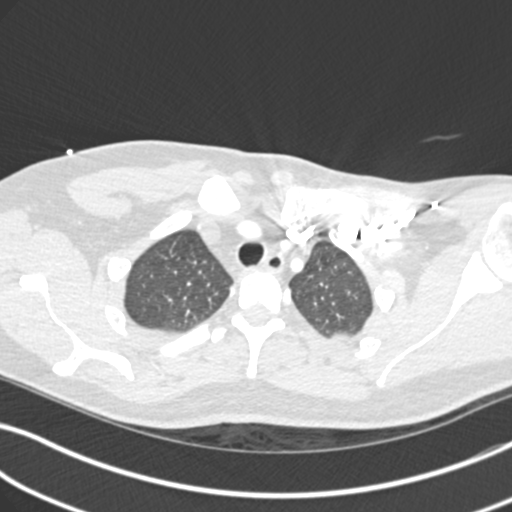
[im 224/258  soft-tissue]
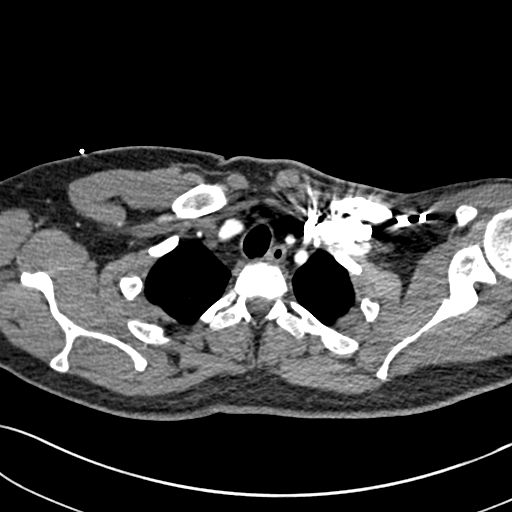
[im 246/258  lung]
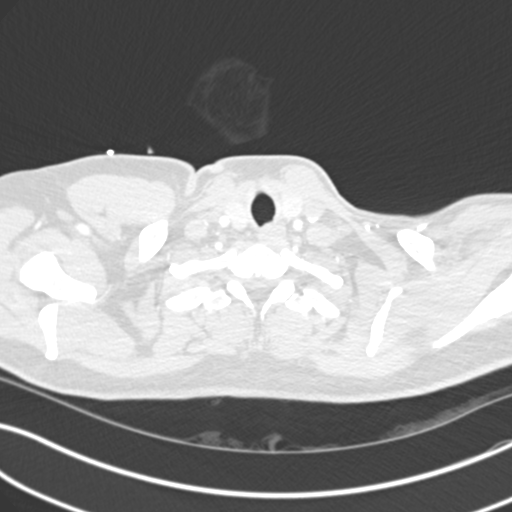

[Series 5: coronal mpr · coronal · 0.53mm/px · 3 of 124 slices shown]
[im 31/124  soft-tissue]
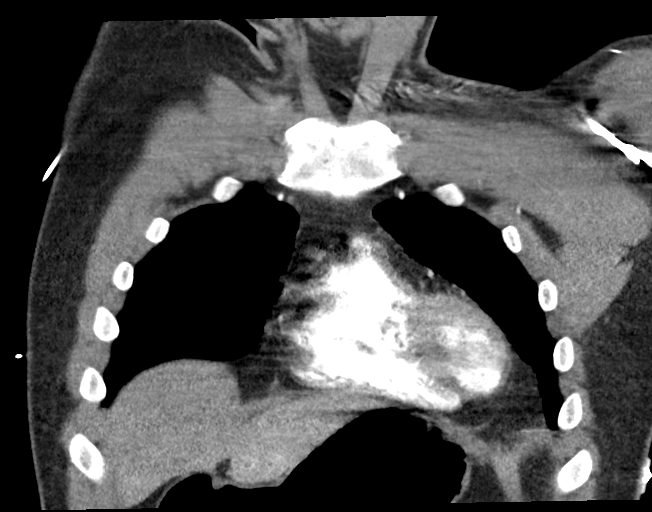
[im 62/124  soft-tissue]
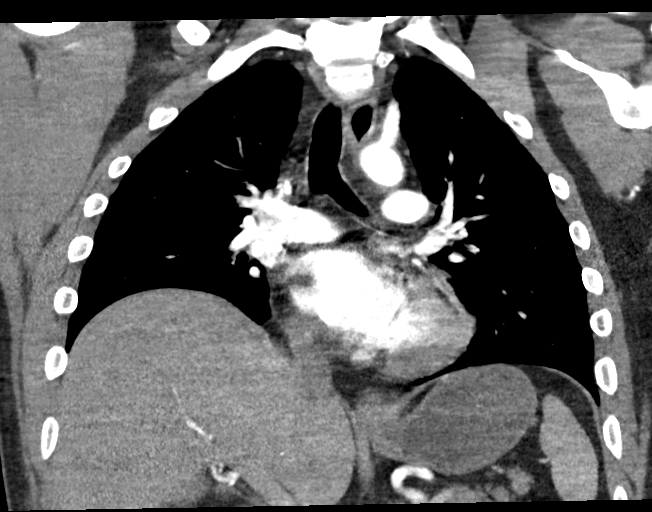
[im 93/124  soft-tissue]
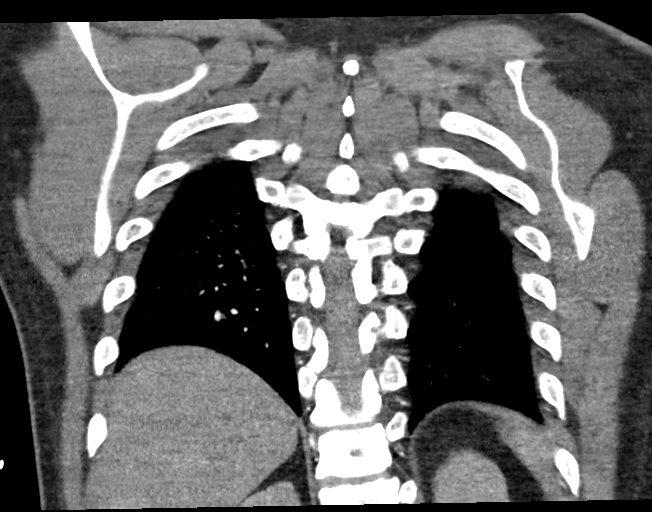

[18 of 46 positions shown; findings below may reference images not displayed]

FINDINGS: Cardiovascular: There is no demonstrable pulmonary embolus. There is
no thoracic aortic aneurysm or dissection. Visualized great vessels
appear unremarkable. No evident pericardial effusion or pericardial
thickening.

Mediastinum/Nodes: Visualized thyroid appears unremarkable. No
evident thoracic adenopathy. No esophageal lesions are appreciable.

Lungs/Pleura: There are scattered areas of mild atelectatic change.
No edema or airspace opacity. No appreciable pleural effusions. No
pneumothorax. Trachea and major bronchial structures appear patent.

Upper Abdomen: Visualized upper abdominal structures appear
unremarkable.

Musculoskeletal: There is mild thoracic dextroscoliosis. No blastic
or lytic bone lesions are evident. There are no chest wall lesions.

Review of the MIP images confirms the above findings.
IMPRESSION: 1. No evident pulmonary embolus. No thoracic aortic aneurysm or
dissection.

2. Scattered areas of mild atelectatic change. No edema or airspace
opacity. No pleural effusions.

3.  No evident adenopathy.

## 2022-03-12 IMAGING — DX DG CHEST 1V PORT
1 series · 1 of 1 positions shown · non-contrast
Comparison: Chest x-ray 07/20/2014.

CLINICAL DATA: Diarrhea.  Multiple falls.  Right-sided rib pain.

EXAM:
PORTABLE CHEST 1 VIEW

[chest ap]
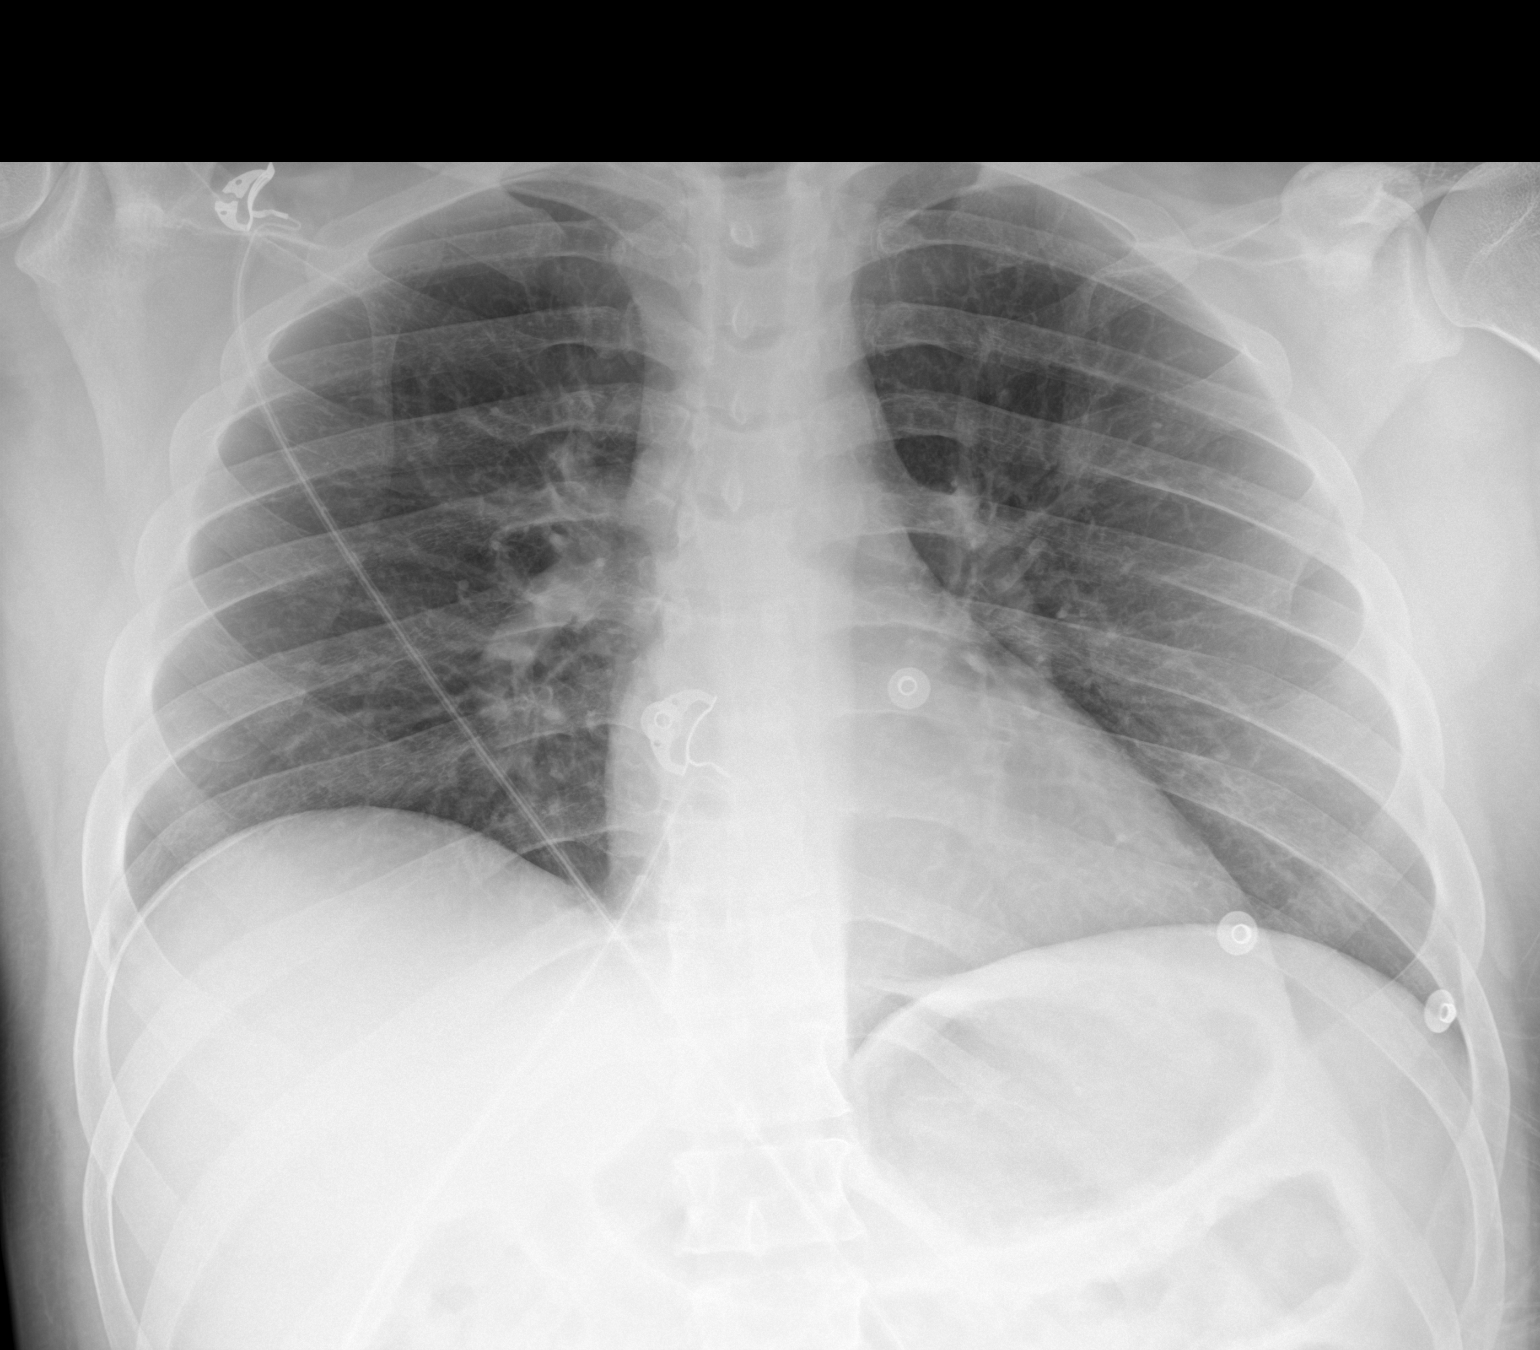

[1 of 1 positions shown; findings below may reference images not displayed]

FINDINGS: Mediastinum and hilar structures normal. Heart size normal. Low lung
volumes. No focal infiltrate. No pleural effusion or pneumothorax.
Mild scoliosis thoracic spine.
IMPRESSION: No acute cardiopulmonary disease.

## 2024-02-18 ENCOUNTER — Other Ambulatory Visit: Payer: Self-pay | Admitting: Neurology

## 2024-02-18 DIAGNOSIS — Z87820 Personal history of traumatic brain injury: Secondary | ICD-10-CM

## 2024-02-26 ENCOUNTER — Ambulatory Visit
Admission: RE | Admit: 2024-02-26 | Discharge: 2024-02-26 | Disposition: A | Discharge status: Home / Self Care | Source: Ambulatory Visit | Attending: Neurology | Admitting: Neurology

## 2024-02-26 DIAGNOSIS — Z87820 Personal history of traumatic brain injury: Secondary | ICD-10-CM | POA: Insufficient documentation

## 2024-09-09 ENCOUNTER — Ambulatory Visit (INDEPENDENT_AMBULATORY_CARE_PROVIDER_SITE_OTHER): Admitting: Otolaryngology

## 2024-09-09 ENCOUNTER — Encounter (INDEPENDENT_AMBULATORY_CARE_PROVIDER_SITE_OTHER): Payer: Self-pay | Admitting: Otolaryngology

## 2024-09-09 VITALS — BP 131/88 | HR 109 | Temp 98.4°F | Ht 71.0 in

## 2024-09-09 DIAGNOSIS — J343 Hypertrophy of nasal turbinates: Secondary | ICD-10-CM | POA: Diagnosis not present

## 2024-09-09 DIAGNOSIS — G4733 Obstructive sleep apnea (adult) (pediatric): Secondary | ICD-10-CM | POA: Diagnosis not present

## 2024-09-09 DIAGNOSIS — J342 Deviated nasal septum: Secondary | ICD-10-CM | POA: Diagnosis not present

## 2024-09-09 NOTE — Progress Notes (Signed)
 Reason for Consult: Deviated septum Referring Physician: Tarrence Enck is an 38 y.o. male.  HPI: History of obstructive sleep apnea and is on CPAP.  He does not tolerate it well.  [facility] and he was told a nasal surgery to help his nasal obstruction might help him with the CPAP.  On a day-to-day basis he does have nasal congestion and obstruction mostly to the left side.  He has been allergy tested previously.  He has been allergy treated.  He has tried 3 different nasal steroid sprays for a month with no results.  He wants to have his deviated septum fixed.  Past Medical History:  Diagnosis Date   Crohn's disease (HCC) 12/2008   PTSD (post-traumatic stress disorder)    Seizures (HCC)    Sleep apnea    Traumatic brain injury [facility])     Past Surgical History:  Procedure Laterality Date   COLONOSCOPY N/A 04/23/2021   Procedure: COLONOSCOPY;  Surgeon: Jinny Carmine, MD;  Location: [facility] ENDOSCOPY;  Service: Endoscopy;  Laterality: N/A;    History reviewed. No pertinent family history.  Social History:  reports that he has been smoking cigars. He has never used smokeless tobacco. He reports current alcohol use. He reports that he does not currently use drugs.  Allergies:  Allergies  Allergen Reactions   Ambien  [Zolpidem  Tartrate]     Violent flashbacks   Food [Peanut-Containing Drug Products] Other (See Comments)    Peanuts Makes crohn's worse/act up   Diltiazem Rash    Medications: I have reviewed the patient's current medications.  No results found for this or any previous visit (from the past 48 hours).  No results found.  ROS Blood pressure 131/88, pulse (!) 109, temperature 98.4 F (36.9 C), height 5' 11 (1.803 m), SpO2 94%. Physical Exam Constitutional:      Appearance: Normal appearance.  HENT:     Head: Normocephalic and atraumatic.     Right Ear: Tympanic membrane is without lesions and middle ear aerated, ear canal and external ear  normal.     Left Ear: Tympanic membrane is without lesions and middle ear aerated, ear canal and external ear normal.     Nose: Nose the septum is deviated to the left moderately.  Turbinates with moderate hypertrophy, No significant swelling or masses.     Oral cavity/oropharynx: Mucous membranes are moist. No lesions or masses    Larynx: normal voice. Mirror attempted without success    Eyes:     Extraocular Movements: Extraocular movements intact.     Conjunctiva/sclera: Conjunctivae normal.     Pupils: Pupils are equal, round, and reactive to light.  Cardiovascular:     Rate and Rhythm: Normal rate.  Pulmonary:     Effort: Pulmonary effort is normal.  Musculoskeletal:     Cervical back: Normal range of motion and neck supple. No rigidity.  Lymphadenopathy:     Cervical: No cervical adenopathy or masses.salivary glands without lesions. .  Neurological:     Mental Status: He is alert. CN 2-12 intact. No nystagmus      Assessment/Plan: Deviated septum and turbinate hypertrophy-we talked about how this will not fix his sleep apnea.  It might make him tolerate his CPAP better.  It also should help his nasal airflow on a day-to-day basis.  He has already tried all the medical therapy and has not had any improvement.  We discussed a septoplasty and turbinate reduction and I will  refer him to one of the partners in the practice to proceed with the surgery if they deem appropriate.  Norleen Notice 09/09/2024, 4:46 PM
